# Patient Record
Sex: Female | Born: 1985 | Race: Black or African American | Hispanic: No | Marital: Single | Smoking: Current every day smoker
Health system: Southern US, Community
[De-identification: ages and names within clinical notes are randomized; demographics above are authoritative.]

## PROBLEM LIST (undated history)

## (undated) ENCOUNTER — Inpatient Hospital Stay (HOSPITAL_COMMUNITY): Payer: Self-pay

## (undated) DIAGNOSIS — Z8619 Personal history of other infectious and parasitic diseases: Secondary | ICD-10-CM

## (undated) DIAGNOSIS — Z789 Other specified health status: Secondary | ICD-10-CM

## (undated) HISTORY — PX: ANTERIOR CRUCIATE LIGAMENT REPAIR: SHX115

## (undated) HISTORY — DX: Personal history of other infectious and parasitic diseases: Z86.19

---

## 2006-11-27 ENCOUNTER — Emergency Department (HOSPITAL_COMMUNITY): Admission: EM | Admit: 2006-11-27 | Discharge: 2006-11-27 | Payer: Self-pay | Admitting: Emergency Medicine

## 2007-09-24 ENCOUNTER — Emergency Department (HOSPITAL_COMMUNITY): Admission: EM | Admit: 2007-09-24 | Discharge: 2007-09-24 | Payer: Self-pay | Admitting: Emergency Medicine

## 2008-01-09 ENCOUNTER — Emergency Department (HOSPITAL_COMMUNITY): Admission: EM | Admit: 2008-01-09 | Discharge: 2008-01-09 | Payer: Self-pay | Admitting: Emergency Medicine

## 2010-12-30 ENCOUNTER — Emergency Department (HOSPITAL_COMMUNITY)
Admission: EM | Admit: 2010-12-30 | Discharge: 2010-12-30 | Disposition: A | Discharge status: Home / Self Care | Payer: Self-pay | Attending: Emergency Medicine | Admitting: Emergency Medicine

## 2010-12-30 ENCOUNTER — Emergency Department (HOSPITAL_COMMUNITY): Payer: Self-pay

## 2010-12-30 DIAGNOSIS — R112 Nausea with vomiting, unspecified: Secondary | ICD-10-CM | POA: Insufficient documentation

## 2010-12-30 DIAGNOSIS — R059 Cough, unspecified: Secondary | ICD-10-CM | POA: Insufficient documentation

## 2010-12-30 DIAGNOSIS — R05 Cough: Secondary | ICD-10-CM | POA: Insufficient documentation

## 2010-12-30 LAB — URINE MICROSCOPIC-ADD ON

## 2010-12-30 LAB — URINALYSIS, ROUTINE W REFLEX MICROSCOPIC
Nitrite: NEGATIVE
Protein, ur: NEGATIVE mg/dL
Specific Gravity, Urine: 1.02 (ref 1.005–1.030)
Urobilinogen, UA: 0.2 mg/dL (ref 0.0–1.0)

## 2013-08-17 ENCOUNTER — Encounter (HOSPITAL_COMMUNITY): Payer: Self-pay | Admitting: Emergency Medicine

## 2013-08-17 DIAGNOSIS — F17200 Nicotine dependence, unspecified, uncomplicated: Secondary | ICD-10-CM | POA: Insufficient documentation

## 2013-08-17 DIAGNOSIS — Z3201 Encounter for pregnancy test, result positive: Secondary | ICD-10-CM | POA: Insufficient documentation

## 2013-08-17 DIAGNOSIS — R109 Unspecified abdominal pain: Secondary | ICD-10-CM | POA: Insufficient documentation

## 2013-08-17 LAB — CBC WITH DIFFERENTIAL/PLATELET
BASOS ABS: 0 10*3/uL (ref 0.0–0.1)
Basophils Relative: 0 % (ref 0–1)
EOS PCT: 2 % (ref 0–5)
Eosinophils Absolute: 0.3 10*3/uL (ref 0.0–0.7)
HCT: 33.6 % — ABNORMAL LOW (ref 36.0–46.0)
Hemoglobin: 11.8 g/dL — ABNORMAL LOW (ref 12.0–15.0)
Lymphocytes Relative: 24 % (ref 12–46)
Lymphs Abs: 3.6 10*3/uL (ref 0.7–4.0)
MCH: 31.9 pg (ref 26.0–34.0)
MCHC: 35.1 g/dL (ref 30.0–36.0)
MCV: 90.8 fL (ref 78.0–100.0)
MONOS PCT: 7 % (ref 3–12)
Monocytes Absolute: 1.1 10*3/uL — ABNORMAL HIGH (ref 0.1–1.0)
NEUTROS PCT: 67 % (ref 43–77)
Neutro Abs: 10.2 10*3/uL — ABNORMAL HIGH (ref 1.7–7.7)
PLATELETS: 401 10*3/uL — AB (ref 150–400)
RBC: 3.7 MIL/uL — AB (ref 3.87–5.11)
RDW: 13.3 % (ref 11.5–15.5)
WBC: 15.2 10*3/uL — AB (ref 4.0–10.5)

## 2013-08-17 LAB — COMPREHENSIVE METABOLIC PANEL
ALBUMIN: 4 g/dL (ref 3.5–5.2)
ALT: 10 U/L (ref 0–35)
AST: 11 U/L (ref 0–37)
Alkaline Phosphatase: 57 U/L (ref 39–117)
BUN: 7 mg/dL (ref 6–23)
CALCIUM: 9.7 mg/dL (ref 8.4–10.5)
CHLORIDE: 101 meq/L (ref 96–112)
CO2: 21 meq/L (ref 19–32)
Creatinine, Ser: 0.67 mg/dL (ref 0.50–1.10)
GFR calc Af Amer: >90 mL/min (ref >90)
Glucose, Bld: 96 mg/dL (ref 70–99)
Potassium: 3.8 mEq/L (ref 3.7–5.3)
SODIUM: 138 meq/L (ref 137–147)
Total Protein: 7.5 g/dL (ref 6.0–8.3)

## 2013-08-17 LAB — PREGNANCY, URINE: PREG TEST UR: POSITIVE — AB

## 2013-08-17 LAB — URINALYSIS, ROUTINE W REFLEX MICROSCOPIC
Bilirubin Urine: NEGATIVE
GLUCOSE, UA: NEGATIVE mg/dL
HGB URINE DIPSTICK: NEGATIVE
Ketones, ur: NEGATIVE mg/dL
LEUKOCYTES UA: NEGATIVE
Nitrite: NEGATIVE
PH: 5.5 (ref 5.0–8.0)
PROTEIN: NEGATIVE mg/dL
Specific Gravity, Urine: 1.028 (ref 1.005–1.030)
Urobilinogen, UA: 0.2 mg/dL (ref 0.0–1.0)

## 2013-08-17 LAB — LIPASE, BLOOD: Lipase: 27 U/L (ref 11–59)

## 2013-08-17 NOTE — ED Notes (Signed)
Pt in c/o lower abd pain and lower back pain x3 weeks, states pain is intermittent, denies urinary or vaginal symptoms, intermittent diarrhea, no distress noted

## 2013-08-18 ENCOUNTER — Emergency Department (HOSPITAL_COMMUNITY): Payer: BC Managed Care – PPO

## 2013-08-18 ENCOUNTER — Emergency Department (HOSPITAL_COMMUNITY)
Admission: EM | Admit: 2013-08-18 | Discharge: 2013-08-18 | Disposition: A | Discharge status: Home / Self Care | Payer: BC Managed Care – PPO | Attending: Emergency Medicine | Admitting: Emergency Medicine

## 2013-08-18 DIAGNOSIS — Z3490 Encounter for supervision of normal pregnancy, unspecified, unspecified trimester: Secondary | ICD-10-CM

## 2013-08-18 DIAGNOSIS — R252 Cramp and spasm: Secondary | ICD-10-CM

## 2013-08-18 DIAGNOSIS — M549 Dorsalgia, unspecified: Secondary | ICD-10-CM

## 2013-08-18 LAB — GC/CHLAMYDIA PROBE AMP
CT PROBE, AMP APTIMA: NEGATIVE
GC Probe RNA: NEGATIVE

## 2013-08-18 LAB — HCG, QUANTITATIVE, PREGNANCY: HCG, BETA CHAIN, QUANT, S: 50349 m[IU]/mL — AB (ref <5)

## 2013-08-18 LAB — ABO/RH: ABO/RH(D): O POS

## 2013-08-18 LAB — WET PREP, GENITAL
TRICH WET PREP: NONE SEEN
Yeast Wet Prep HPF POC: NONE SEEN

## 2013-08-18 MED ORDER — PRENATAL COMPLETE 14-0.4 MG PO TABS: 1.0000 | ORAL_TABLET | Freq: Every day | ORAL | Status: DC

## 2013-08-18 NOTE — ED Provider Notes (Signed)
Medical screening examination/treatment/procedure(s) were performed by non-physician practitioner and as supervising physician I was immediately available for consultation/collaboration.   Dione Boozeavid Ahri Olson, MD 08/18/13 [phone]

## 2013-08-18 NOTE — Discharge Instructions (Signed)
Use Tylenol for aches and pains. Use heating pads for comfort over sore muscles. Follow up with your OB/GYN provider for continued evaluation and treatment.    Pregnancy If you are planning on getting pregnant, it is a good idea to make a preconception appointment with your caregiver to discuss having a healthy lifestyle before getting pregnant. This includes diet, weight, exercise, taking prenatal vitamins (especially folic acid, which helps prevent brain and spinal cord defects), avoiding alcohol, smoking and illegal drugs, medical problems (diabetes, convulsions), family history of genetic problems, working conditions, and immunizations. It is better to have knowledge of these things and do something about them before getting pregnant. During your pregnancy, it is important to follow certain guidelines in order to have a healthy baby. It is very important to get good prenatal care and follow your caregiver's instructions. Prenatal care includes all the medical care you receive before your baby's birth. This helps to prevent problems during the pregnancy and childbirth. HOME CARE INSTRUCTIONS   Start your prenatal visits by the 12th week of pregnancy or earlier, if possible. At first, appointments are usually scheduled monthly. They become more frequent in the last 2 months before delivery. It is important that you keep your caregiver's appointments and follow your caregiver's instructions regarding medication use, exercise, and diet.  During pregnancy, you are providing food for you and your baby. Eat a regular, well-balanced diet. Choose foods such as meat, fish, milk and other dairy products, vegetables, fruits, whole-grain breads and cereals. Your caregiver will inform you of the ideal weight gain depending on your current height and weight. Drink lots of liquids. Try to drink 8 glasses of water a day.  Alcohol is associated with a number of birth defects including fetal alcohol syndrome. It is  best to avoid alcohol completely. Smoking will cause low birth rate and prematurity. Use of alcohol and nicotine during your pregnancy also increases the chances that your child will be chemically dependent later in their life and may contribute to SIDS (Sudden Infant Death Syndrome).  Do not use illegal drugs.  Only take prescription or over-the-counter medications that are recommended by your caregiver. Other medications can cause genetic and physical problems in the baby.  Morning sickness can often be helped by keeping soda crackers at the bedside. Eat a few before getting up in the morning.  A sexual relationship may be continued until near the end of pregnancy if there are no other problems such as early (premature) leaking of amniotic fluid from the membranes, vaginal bleeding, painful intercourse or belly (abdominal) pain.  Exercise regularly. Check with your caregiver if you are unsure of the safety of some of your exercises.  Do not use hot tubs, steam rooms or saunas. These increase the risk of fainting and hurting yourself and the baby. Swimming is OK for exercise. Get plenty of rest, including afternoon naps when possible, especially in the third trimester.  Avoid toxic odors and chemicals.  Do not wear high heels. They may cause you to lose your balance and fall.  Do not lift over 5 pounds. If you do lift anything, lift with your legs and thighs, not your back.  Avoid long trips, especially in the third trimester.  If you have to travel out of the city or state, take a copy of your medical records with you. SEEK IMMEDIATE MEDICAL CARE IF:   You develop an unexplained oral temperature above 102 F (38.9 C), or as your caregiver suggests.  You have  leaking of fluid from the vagina. If leaking membranes are suspected, take your temperature and inform your caregiver of this when you call.  There is vaginal spotting or bleeding. Notify your caregiver of the amount and how many  pads are used.  You continue to feel sick to your stomach (nauseous) and have no relief from remedies suggested, or you throw up (vomit) blood or coffee ground like materials.  You develop upper abdominal pain.  You have round ligament discomfort in the lower abdominal area. This still must be evaluated by your caregiver.  You feel contractions of the uterus.  You do not feel the baby move, or there is less movement than before.  You have painful urination.  You have abnormal vaginal discharge.  You have persistent diarrhea.  You get a severe headache.  You have problems with your vision.  You develop muscle weakness.  You feel dizzy and faint.  You develop shortness of breath.  You develop chest pain.  You have back pain that travels down to your leg and feet.  You feel irregular or a very fast heartbeat.  You develop excessive weight gain in a short period of time (5 pounds in 3 to 5 days).  You are involved in a domestic violence situation. Document Released: 07/24/2005 Document Revised: 01/23/2012 Document Reviewed: 01/15/2009 [facility] Patient Information 2014 [place], [place].

## 2013-08-18 NOTE — ED Provider Notes (Signed)
CSN: [id number]     Arrival date & time 08/17/13  1900 History   First MD Initiated Contact with Patient 08/18/13 0105     Chief Complaint  Patient presents with  . Abdominal Pain  . Back Pain   HPI  History provided by the patient. Patient is a 28 year old female presents with complaints of lower abdomen and back pains and cramps for the past 3 weeks. Patient states that she does believe she is pregnant with a positive home pregnancy test. Her last normal menstrual period was November 11. Patient states that over the last several weeks she has had some intermittent cramping and discomfort. She has not been using any medications to treat symptoms. She does report symptoms are improved with some positions and laying in a comfortable bed. Denies having any associated vaginal discharge or bleeding. No urinary symptoms. No dysuria, hematuria or urinary frequency. Denies any fever, chills or sweats.    History reviewed. No pertinent past medical history. History reviewed. No pertinent past surgical history. History reviewed. No pertinent family history. History  Substance Use Topics  . Smoking status: Smoker, Current Status Unknown  . Smokeless tobacco: Not on file  . Alcohol Use: Not on file   OB History   Grav Para Term Preterm Abortions TAB SAB Ect Mult Living                 Review of Systems  Constitutional: Negative for fever.  Gastrointestinal: Positive for abdominal pain. Negative for vomiting, diarrhea and constipation.  Genitourinary: Negative for dysuria, frequency, hematuria, flank pain, vaginal bleeding and vaginal discharge.  Musculoskeletal: Positive for back pain.  All other systems reviewed and are negative.    Allergies  Other  Home Medications   Current Outpatient Rx  Name  Route  Sig  Dispense  Refill  . acetaminophen (TYLENOL) 500 MG tablet   Oral   Take 1,000 mg by mouth 2 (two) times daily as needed for mild pain.         . clindamycin-tretinoin  (ZIANA) gel   Topical   Apply 1 application topically at bedtime.         . Multiple Vitamins-Minerals (MULTIVITAMIN PO)   Oral   Take 1 tablet by mouth daily.          BP 108/65  Pulse 78  Temp(Src) 98.5 F (36.9 C) (Oral)  Resp 18  SpO2 100% Physical Exam  Nursing note and vitals reviewed. Constitutional: She is oriented to person, place, and time. She appears well-developed and well-nourished. No distress.  HENT:  Head: Normocephalic.  Cardiovascular: Normal rate and regular rhythm.   Pulmonary/Chest: Effort normal and breath sounds normal. No respiratory distress. She has no wheezes. She has no rales.  Abdominal: Soft. She exhibits no distension. There is tenderness. There is no rebound and no guarding.   Mild lower abdomen suprapubic tenderness  Genitourinary:  Chaperone was present. Cervix closed. Thick white vaginal discharge present. Mild tenderness. No masses.  Neurological: She is alert and oriented to person, place, and time.  Skin: Skin is warm and dry. No rash noted.  Psychiatric: She has a normal mood and affect. Her behavior is normal.    ED Course  Procedures    COORDINATION OF CARE:  Nursing notes reviewed. Vital signs reviewed. Initial pt interview and examination performed.   Patient seen and evaluated. She appears comfortable no acute distress or significant pain and discomfort. She does not appear severely ill or toxic. Patient did have  positive home pregnancy test. Last normal menstrual period was November 11. Discussed work up plan with pt at bedside, which includes lab testing, ultrasound and pelvic exam. Pt agrees with plan.    Results for orders placed during the hospital encounter of 08/18/13  WET PREP, GENITAL      Result Value Range   Yeast Wet Prep HPF POC NONE SEEN  NONE SEEN   Trich, Wet Prep NONE SEEN  NONE SEEN   Clue Cells Wet Prep HPF POC MODERATE (*) NONE SEEN   WBC, Wet Prep HPF POC FEW (*) NONE SEEN  CBC WITH DIFFERENTIAL       Result Value Range   WBC 15.2 (*) 4.0 - 10.5 K/uL   RBC 3.70 (*) 3.87 - 5.11 MIL/uL   Hemoglobin 11.8 (*) 12.0 - 15.0 g/dL   HCT 04.533.6 (*) 40.936.0 - 81.146.0 %   MCV 90.8  78.0 - 100.0 fL   MCH 31.9  26.0 - 34.0 pg   MCHC 35.1  30.0 - 36.0 g/dL   RDW 91.413.3  78.211.5 - 95.615.5 %   Platelets 401 (*) 150 - 400 K/uL   Neutrophils Relative % 67  43 - 77 %   Lymphocytes Relative 24  12 - 46 %   Monocytes Relative 7  3 - 12 %   Eosinophils Relative 2  0 - 5 %   Basophils Relative 0  0 - 1 %   Neutro Abs 10.2 (*) 1.7 - 7.7 K/uL   Lymphs Abs 3.6  0.7 - 4.0 K/uL   Monocytes Absolute 1.1 (*) 0.1 - 1.0 K/uL   Eosinophils Absolute 0.3  0.0 - 0.7 K/uL   Basophils Absolute 0.0  0.0 - 0.1 K/uL   WBC Morphology ATYPICAL LYMPHOCYTES    COMPREHENSIVE METABOLIC PANEL      Result Value Range   Sodium 138  137 - 147 mEq/L   Potassium 3.8  3.7 - 5.3 mEq/L   Chloride 101  96 - 112 mEq/L   CO2 21  19 - 32 mEq/L   Glucose, Bld 96  70 - 99 mg/dL   BUN 7  6 - 23 mg/dL   Creatinine, Ser 2.130.67  0.50 - 1.10 mg/dL   Calcium 9.7  8.4 - 08.610.5 mg/dL   Total Protein 7.5  6.0 - 8.3 g/dL   Albumin 4.0  3.5 - 5.2 g/dL   AST 11  0 - 37 U/L   ALT 10  0 - 35 U/L   Alkaline Phosphatase 57  39 - 117 U/L   Total Bilirubin <0.2 (*) 0.3 - 1.2 mg/dL   GFR calc non Af Amer >90  >90 mL/min   GFR calc Af Amer >90  >90 mL/min  LIPASE, BLOOD      Result Value Range   Lipase 27  11 - 59 U/L  URINALYSIS, ROUTINE W REFLEX MICROSCOPIC      Result Value Range   Color, Urine YELLOW  YELLOW   APPearance CLEAR  CLEAR   Specific Gravity, Urine 1.028  1.005 - 1.030   pH 5.5  5.0 - 8.0   Glucose, UA NEGATIVE  NEGATIVE mg/dL   Hgb urine dipstick NEGATIVE  NEGATIVE   Bilirubin Urine NEGATIVE  NEGATIVE   Ketones, ur NEGATIVE  NEGATIVE mg/dL   Protein, ur NEGATIVE  NEGATIVE mg/dL   Urobilinogen, UA 0.2  0.0 - 1.0 mg/dL   Nitrite NEGATIVE  NEGATIVE   Leukocytes, UA NEGATIVE  NEGATIVE  PREGNANCY, URINE  Result Value Range   Preg Test, Ur  POSITIVE (*) NEGATIVE  HCG, QUANTITATIVE, PREGNANCY      Result Value Range   hCG, Beta Chain, Quant, S 40981 (*) <5 mIU/mL  ABO/RH      Result Value Range   ABO/RH(D) O POS     No rh immune globuloin NOT A RH IMMUNE GLOBULIN CANDIDATE, PT RH POSITIVE         Imaging Review US Ob Comp Less 14 Wks  08/18/2013   CLINICAL DATA:  Abdominal pain and pregnancy  EXAM: OBSTETRIC <14 WK [place] AND TRANSVAGINAL OB US  TECHNIQUE: Both transabdominal and transvaginal ultrasound examinations were performed for complete evaluation of the gestation as well as the maternal uterus, adnexal regions, and pelvic cul-de-sac. Transvaginal technique was performed to assess early pregnancy.  COMPARISON:  None.  FINDINGS: Intrauterine gestational sac: Visualized/normal in shape. No subchorionic hematoma.  Yolk sac:  Present  Embryo:  Present  Cardiac Activity: Present  Heart Rate:  168 bpm  CRL:   18  mm   8 w 2 d                  [place] EDC: 03/28/2014  Maternal uterus/adnexae: The ovaries are symmetric and normal in size. There is a small volume of simple appearing fluid in the lower uterine segment and cervical canal. Some thickening in the left aspect of the upper uterine body raises the question of a fibroid in this location, although the relatively homogeneous echotexture and lack of shadowing argues against a uterine mass. No significant free fluid P  IMPRESSION: 1. Single living intrauterine gestation, estimated age 52 weeks 2 days. 2. Small volume fluid in the lower uterine/endocervical canal.   Electronically Signed   By: Tiburcio Pea M.D.   On: 08/18/2013 02:34   US Ob Transvaginal  08/18/2013   CLINICAL DATA:  Abdominal pain and pregnancy  EXAM: OBSTETRIC <14 WK [place] AND TRANSVAGINAL OB US  TECHNIQUE: Both transabdominal and transvaginal ultrasound examinations were performed for complete evaluation of the gestation as well as the maternal uterus, adnexal regions, and pelvic cul-de-sac. Transvaginal technique was  performed to assess early pregnancy.  COMPARISON:  None.  FINDINGS: Intrauterine gestational sac: Visualized/normal in shape. No subchorionic hematoma.  Yolk sac:  Present  Embryo:  Present  Cardiac Activity: Present  Heart Rate:  168 bpm  CRL:   18  mm   8 w 2 d                  [place] EDC: 03/28/2014  Maternal uterus/adnexae: The ovaries are symmetric and normal in size. There is a small volume of simple appearing fluid in the lower uterine segment and cervical canal. Some thickening in the left aspect of the upper uterine body raises the question of a fibroid in this location, although the relatively homogeneous echotexture and lack of shadowing argues against a uterine mass. No significant free fluid P  IMPRESSION: 1. Single living intrauterine gestation, estimated age 52 weeks 2 days. 2. Small volume fluid in the lower uterine/endocervical canal.   Electronically Signed   By: Tiburcio Pea M.D.   On: 08/18/2013 02:34     MDM   1. Pregnancy   2. Muscle cramps   3. Back pain       Angus Seller, PA-C 08/18/13 [phone]

## 2013-09-16 LAB — OB RESULTS CONSOLE HEPATITIS B SURFACE ANTIGEN: Hepatitis B Surface Ag: NEGATIVE

## 2013-09-16 LAB — OB RESULTS CONSOLE HIV ANTIBODY (ROUTINE TESTING): HIV: NONREACTIVE

## 2013-09-16 LAB — OB RESULTS CONSOLE GC/CHLAMYDIA
CHLAMYDIA, DNA PROBE: NEGATIVE
GC PROBE AMP, GENITAL: NEGATIVE

## 2013-09-16 LAB — OB RESULTS CONSOLE RPR: RPR: NONREACTIVE

## 2013-09-16 LAB — OB RESULTS CONSOLE ABO/RH: RH Type: POSITIVE

## 2013-09-16 LAB — OB RESULTS CONSOLE ANTIBODY SCREEN: Antibody Screen: NEGATIVE

## 2013-09-16 LAB — OB RESULTS CONSOLE RUBELLA ANTIBODY, IGM: RUBELLA: NON-IMMUNE/NOT IMMUNE

## 2014-01-15 ENCOUNTER — Inpatient Hospital Stay (HOSPITAL_COMMUNITY)
Admission: AD | Admit: 2014-01-15 | Discharge: 2014-01-15 | Disposition: A | Discharge status: Other Acute Inpatient Hospital | Payer: BC Managed Care – PPO | Source: Ambulatory Visit | Attending: Obstetrics and Gynecology | Admitting: Obstetrics and Gynecology

## 2014-01-15 ENCOUNTER — Encounter (HOSPITAL_COMMUNITY): Payer: Self-pay | Admitting: Emergency Medicine

## 2014-01-15 DIAGNOSIS — S0990XA Unspecified injury of head, initial encounter: Secondary | ICD-10-CM | POA: Diagnosis not present

## 2014-01-15 DIAGNOSIS — Y9389 Activity, other specified: Secondary | ICD-10-CM | POA: Diagnosis not present

## 2014-01-15 DIAGNOSIS — Z87891 Personal history of nicotine dependence: Secondary | ICD-10-CM | POA: Diagnosis not present

## 2014-01-15 DIAGNOSIS — Z79899 Other long term (current) drug therapy: Secondary | ICD-10-CM | POA: Diagnosis not present

## 2014-01-15 DIAGNOSIS — O9989 Other specified diseases and conditions complicating pregnancy, childbirth and the puerperium: Secondary | ICD-10-CM | POA: Insufficient documentation

## 2014-01-15 DIAGNOSIS — O99891 Other specified diseases and conditions complicating pregnancy: Secondary | ICD-10-CM

## 2014-01-15 DIAGNOSIS — Z3490 Encounter for supervision of normal pregnancy, unspecified, unspecified trimester: Secondary | ICD-10-CM

## 2014-01-15 DIAGNOSIS — Y92410 Unspecified street and highway as the place of occurrence of the external cause: Secondary | ICD-10-CM | POA: Diagnosis not present

## 2014-01-15 DIAGNOSIS — S3981XA Other specified injuries of abdomen, initial encounter: Secondary | ICD-10-CM | POA: Insufficient documentation

## 2014-01-15 DIAGNOSIS — R51 Headache: Secondary | ICD-10-CM

## 2014-01-15 MED ORDER — ACETAMINOPHEN 325 MG PO TABS
650.0000 mg | ORAL_TABLET | Freq: Once | ORAL | Status: AC
  Administered 2014-01-15: 650 mg via ORAL
  Filled 2014-01-15: qty 2

## 2014-01-15 NOTE — ED Notes (Signed)
Vital signs stable. 
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
## 2014-01-15 NOTE — ED Notes (Signed)
[facility] at bedside 

## 2014-01-15 NOTE — Progress Notes (Signed)
Dr Ambrose Mantle notified of pt status. Pt to be transferred to MAU at [facility] for monitoring. FH Cat 1, pt stable for transfer.

## 2014-01-15 NOTE — Discharge Instructions (Signed)
Placental Abruption °Placental abruption is when the placenta partially or completely separates from the uterus before the baby (fetus) is born. The placenta is the organ that provides nourishment to the baby. Normally, the placenta does not detach from the womb until after the baby is born. When it is large and separates before the baby is born, it may be a threat to the baby and mother's life. A small abruption may not be noticed until after the birth. Placenta abruption is uncommon. °CAUSES  °Often times, your caregiver will not know the cause. However, some uncommon causes include:  °· Abdominal injury. °· Turning a baby that is presenting their buttocks first (breech) or is lying sideways in the uterus (transverse) to a headfirst position (external cephalic version). °· Delivering the first twin. °· Sudden loss of amniotic fluid (premature rupture of the membranes). °· Abnormally short umbilical cord. °SYMPTOMS  °When the placental separation is small, it may not produce symptoms. There may be a small amount of belly (abdominal) pain or slight amount of vaginal bleeding.  °Symptoms of severe problems will depend on the size of the separation and the stage of pregnancy. Symptoms may include:  °· Vaginal bleeding. °· Uterine tenderness. °· Fetal distress detected by fetal monitoring. °· Severe abdominal pain with tenderness. °· Continual uterine contraction (tetany). °· Back pain. °· Maternal shock with severe hemorrhage. °RISK FACTORS °· History of abruption. °· High blood pressure. °· Smoking and alcohol intake. °· Blood clotting problems. °· Too much fluid in the baby's sac (polyhydramnios). °· Twins or more. °· High blood pressure during pregnancy (preeclampsia) or seizures and convulsions (eclampsia). °· Diabetes. °· Having had more than four children. °· Pregnancy in older women (35 years or older). °· Illegal drugs. °· Injury or trauma to the abdomen. °PREVENTION  °Prevention begins with good prenatal  care: °· Stop using alcohol, illegal drugs and smoking. °· Obey traffic laws and practice defensive driving. °· Avoid dangerous activities such as snow and water skiing, horseback riding, motorcycles and mountain climbing. °· Wear seat belts properly and at all times. °· Control high blood pressure and diabetes. °· Avoid situations where there is domestic violence. °DIAGNOSIS  °Placental abruption is suspected when a pregnant woman develops sudden uterine pain with or without bleeding. The uterus usually is very tender and hard. It may be enlarging because of the bleeding and the fetus may show signs of distress. Distress may show up as an abnormal heart rate or rhythm. When your caregiver sees these signs, they may do an ultrasound test to look for a clot behind the placenta. They will also do blood work to make sure there are not clotting problems, signs of too much blood loss, or not enough healthy red blood cells (anemia). These all require a blood transfusion. °TREATMENT  °Treatment depends on many things such as:  °· The amount of bleeding. °· Distress with the baby or mother. °· How far along the pregnancy is. °· The maturity of the baby. °This condition is usually an emergency. When the mother or fetus is in distress, it requires treatment right away to protect the safety of the mother and infant. If the baby is mature and delivery time is near:  °· Careful observation may allow the baby to be delivered vaginally. A vaginal birth is usually preferred over caesarean section unless there is fetal distress. °· Sometimes, a caesarean section cannot be done if there are clotting problems or a DIC. °If the symptoms are severe and   delivery is not about to happen:  °· A cesarean section may be done. This is an operation on the abdomen to remove the baby. °If the symptoms are mild and there are no signs of distress with the baby or mother:  °· You may have to stay in the hospital for a couple of days for  observation. °· You may be given steroid medication to get the baby's lungs mature when necessary. °· If you are Rh negative and the father is Rh positive, you may get Rho-gam to prevent Rh problems in the baby. °· When everything is ok and safe, you may go home and be placed on bed rest. °HOME CARE INSTRUCTIONS  °· Take all medications as directed by your caregiver. °· Keep all your follow-up prenatal visits. °· Arrange for help at home before and after you deliver the baby, especially if you had a Cesarean section or a large amount of bleeding. °· Get plenty of rest and sleep, especially after the baby is born. °· Eat a nutritious and balanced diet. °· Do not have sexual intercourse, use tampons or douche with out your caregiver's permission. °SEEK IMMEDIATE MEDICAL CARE IF: °Before delivery: °· Any type of vaginal bleeding. °· Abdominal pain °· Continuous uterine contractions. °· A hard, tender uterus. °· You do not feel the baby move or the baby has very little movement. °After delivery: °· Started to pass large clots or pieces of tissue. This may be small pieces of placenta left following delivery. °· Noticed that you are soaking more than one sanitary pad per hour, for several hours. °· Heavy, bright-red bleeding which occurs four days or more after delivery. °· A vaginal discharge which has a bad smell. °· An unexplained oral temperature above 100° F (37.8° C). °· Episodes of lightheadedness or fainting. °· Shortness of breath or a rapid heartbeat with very little activity (exertion). °· Abdominal pain. °· Leg or chest pain. °If you are having any of these symptoms, call your caregiver right away. °Document Released: 07/24/2005 Document Revised: 10/16/2011 Document Reviewed: 11/12/2008 °[employer]® Patient Information ©2014 [employer]. ° °

## 2014-01-15 NOTE — ED Notes (Signed)
Pt placed into gown upon arrival to room. Pt placed on monitor. Pt monitored by 12 lead, blood pressure, and pulse ox. Fetal monitor at bedside.

## 2014-01-15 NOTE — ED Notes (Signed)
Attempted to place IV when patient refused. She stated she did not wont one unless she absolutely needs to have one.
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
## 2014-01-15 NOTE — ED Notes (Signed)
MD at bedside. 

## 2014-01-15 NOTE — ED Notes (Signed)
OB Rapid response RN at bedside.   
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
## 2014-02-27 LAB — OB RESULTS CONSOLE GBS: STREP GROUP B AG: NEGATIVE

## 2014-03-26 ENCOUNTER — Inpatient Hospital Stay (HOSPITAL_COMMUNITY)
Admission: AD | Admit: 2014-03-26 | Payer: No Typology Code available for payment source | Source: Ambulatory Visit | Admitting: Obstetrics and Gynecology

## 2014-03-30 ENCOUNTER — Encounter (HOSPITAL_COMMUNITY): Payer: Self-pay | Admitting: *Deleted

## 2014-03-30 ENCOUNTER — Telehealth (HOSPITAL_COMMUNITY): Payer: Self-pay | Admitting: *Deleted

## 2014-03-30 NOTE — Telephone Encounter (Signed)
Preadmission screen  

## 2014-03-31 ENCOUNTER — Encounter (HOSPITAL_COMMUNITY): Payer: Self-pay

## 2014-03-31 ENCOUNTER — Inpatient Hospital Stay (HOSPITAL_COMMUNITY)
Admission: RE | Admit: 2014-03-31 | Discharge: 2014-04-06 | DRG: 766 | Disposition: A | Discharge status: Home / Self Care | Payer: BC Managed Care – PPO | Source: Ambulatory Visit | Attending: Obstetrics and Gynecology | Admitting: Obstetrics and Gynecology

## 2014-03-31 VITALS — BP 136/80 | HR 101 | Temp 98.5°F | Resp 20 | Ht 66.0 in | Wt 235.0 lb

## 2014-03-31 DIAGNOSIS — O480 Post-term pregnancy: Secondary | ICD-10-CM | POA: Diagnosis present

## 2014-03-31 DIAGNOSIS — Z6838 Body mass index (BMI) 38.0-38.9, adult: Secondary | ICD-10-CM | POA: Diagnosis not present

## 2014-03-31 DIAGNOSIS — Z8249 Family history of ischemic heart disease and other diseases of the circulatory system: Secondary | ICD-10-CM | POA: Diagnosis not present

## 2014-03-31 DIAGNOSIS — Z3490 Encounter for supervision of normal pregnancy, unspecified, unspecified trimester: Secondary | ICD-10-CM

## 2014-03-31 DIAGNOSIS — Z98891 History of uterine scar from previous surgery: Secondary | ICD-10-CM

## 2014-03-31 DIAGNOSIS — E669 Obesity, unspecified: Secondary | ICD-10-CM | POA: Diagnosis present

## 2014-03-31 DIAGNOSIS — Z833 Family history of diabetes mellitus: Secondary | ICD-10-CM

## 2014-03-31 DIAGNOSIS — Z3493 Encounter for supervision of normal pregnancy, unspecified, third trimester: Secondary | ICD-10-CM

## 2014-03-31 DIAGNOSIS — Z87891 Personal history of nicotine dependence: Secondary | ICD-10-CM | POA: Diagnosis not present

## 2014-03-31 DIAGNOSIS — O99891 Other specified diseases and conditions complicating pregnancy: Secondary | ICD-10-CM | POA: Diagnosis present

## 2014-03-31 DIAGNOSIS — O99214 Obesity complicating childbirth: Secondary | ICD-10-CM

## 2014-03-31 HISTORY — DX: Other specified health status: Z78.9

## 2014-03-31 LAB — CBC
HEMATOCRIT: 35.1 % — AB (ref 36.0–46.0)
Hemoglobin: 11.9 g/dL — ABNORMAL LOW (ref 12.0–15.0)
MCH: 30.6 pg (ref 26.0–34.0)
MCHC: 33.9 g/dL (ref 30.0–36.0)
MCV: 90.2 fL (ref 78.0–100.0)
Platelets: 338 10*3/uL (ref 150–400)
RBC: 3.89 MIL/uL (ref 3.87–5.11)
RDW: 14.7 % (ref 11.5–15.5)
WBC: 14.2 10*3/uL — ABNORMAL HIGH (ref 4.0–10.5)

## 2014-03-31 MED ORDER — IBUPROFEN 600 MG PO TABS: 600.0000 mg | ORAL_TABLET | Freq: Four times a day (QID) | ORAL | Status: DC | PRN

## 2014-03-31 MED ORDER — OXYTOCIN BOLUS FROM INFUSION: 500.0000 mL | INTRAVENOUS | Status: DC

## 2014-03-31 MED ORDER — OXYTOCIN 40 UNITS IN LACTATED RINGERS INFUSION - SIMPLE MED
1.0000 m[IU]/min | INTRAVENOUS | Status: DC
  Administered 2014-04-01: 2 m[IU]/min via INTRAVENOUS
  Filled 2014-03-31: qty 1000

## 2014-03-31 MED ORDER — MISOPROSTOL 25 MCG QUARTER TABLET
25.0000 ug | ORAL_TABLET | ORAL | Status: DC | PRN
  Administered 2014-03-31: 25 ug via VAGINAL
  Filled 2014-03-31: qty 0.25

## 2014-03-31 MED ORDER — BUTORPHANOL TARTRATE 1 MG/ML IJ SOLN
1.0000 mg | INTRAMUSCULAR | Status: DC | PRN
  Administered 2014-04-01: 1 mg via INTRAVENOUS
  Filled 2014-03-31: qty 1

## 2014-03-31 MED ORDER — ONDANSETRON HCL 4 MG/2ML IJ SOLN: 4.0000 mg | Freq: Four times a day (QID) | INTRAMUSCULAR | Status: DC | PRN

## 2014-03-31 MED ORDER — LACTATED RINGERS IV SOLN: 500.0000 mL | INTRAVENOUS | Status: DC | PRN

## 2014-03-31 MED ORDER — ZOLPIDEM TARTRATE 5 MG PO TABS: 5.0000 mg | ORAL_TABLET | Freq: Every evening | ORAL | Status: DC | PRN

## 2014-03-31 MED ORDER — LIDOCAINE HCL (PF) 1 % IJ SOLN: 30.0000 mL | INTRAMUSCULAR | Status: DC | PRN

## 2014-03-31 MED ORDER — ACETAMINOPHEN 325 MG PO TABS
650.0000 mg | ORAL_TABLET | ORAL | Status: DC | PRN
Start: 2014-03-31 — End: 2014-04-02

## 2014-03-31 MED ORDER — OXYTOCIN 40 UNITS IN LACTATED RINGERS INFUSION - SIMPLE MED: 62.5000 mL/h | INTRAVENOUS | Status: DC

## 2014-03-31 MED ORDER — CITRIC ACID-SODIUM CITRATE 334-500 MG/5ML PO SOLN
30.0000 mL | ORAL | Status: DC | PRN
  Administered 2014-04-02: 30 mL via ORAL
  Filled 2014-03-31: qty 15

## 2014-03-31 MED ORDER — OXYCODONE-ACETAMINOPHEN 5-325 MG PO TABS: 1.0000 | ORAL_TABLET | ORAL | Status: DC | PRN

## 2014-03-31 MED ORDER — TERBUTALINE SULFATE 1 MG/ML IJ SOLN: 0.2500 mg | Freq: Once | INTRAMUSCULAR | Status: AC | PRN

## 2014-03-31 MED ORDER — LACTATED RINGERS IV SOLN
INTRAVENOUS | Status: DC
  Administered 2014-03-31 – 2014-04-02 (×5): via INTRAVENOUS

## 2014-03-31 NOTE — H&P (Signed)
Kathryn Moyer is a 28 y.o. female G1P0 at 63 5/7 weeks (EDD 03/26/14 by LMP c/w 8 weeks [place]) presenting for IOL.  Prenatal care uneventful.  She is rubella non-immune.  There was some fundal lag, but [place] 7/30 showed normal growth at the 40%ile.  Maternal Medical History:  Contractions: Frequency: irregular.   Perceived severity is mild.    Fetal activity: Perceived fetal activity is normal.    Prenatal Complications - Diabetes: none.    OB History   Grav Para Term Preterm Abortions TAB SAB Ect Mult Living   1              Past Medical History  Diagnosis Date  . Hx of varicella    Past Surgical History  Procedure Laterality Date  . Anterior cruciate ligament repair      R klnee   Family History: family history includes Cancer in her maternal grandmother; Diabetes in her maternal grandfather and paternal grandfather; Hearing loss in her paternal grandfather and paternal grandmother; Hypertension in her father and mother. Social History:  reports that she has quit smoking. She does not have any smokeless tobacco history on file. She reports that she does not drink alcohol or use illicit drugs.   Prenatal Transfer Tool  Maternal Diabetes: No Genetic Screening: Normal Maternal Ultrasounds/Referrals: Normal Fetal Ultrasounds or other Referrals:  None Maternal Substance Abuse:  No Significant Maternal Medications:  None Significant Maternal Lab Results:  None Other Comments:  None  ROS    There were no vitals taken for this visit. Maternal Exam:  Uterine Assessment: Contraction strength is mild.  Contraction frequency is irregular.   Abdomen: Patient reports no abdominal tenderness. Fetal presentation: vertex  Introitus: Normal vulva. Normal vagina.  Pelvis: adequate for delivery.      Physical Exam  Constitutional: She appears well-developed and well-nourished.  Cardiovascular: Normal rate and regular rhythm.   Respiratory: Effort normal.  GI: Soft.  Genitourinary:  Vagina normal and uterus normal.  Neurological: She is alert.  Psychiatric: Her behavior is normal.    Prenatal labs: ABO, Rh: O/Positive/-- (02/10 0000) Antibody: Negative (02/10 0000) Rubella: Nonimmune (02/10 0000) RPR: Nonreactive (02/10 0000)  HBsAg: Negative (02/10 0000)  HIV: Non-reactive (02/10 0000)  GBS: Negative (07/24 0000)  First trimester screen and AFP WNL One hour GTT 95  Assessment/Plan: Pt with uncomplicated postterm pregnancy for ripening and IOL.  Plan cytotec this PM and pitocin/AROM in AM.   Huel Cote W 03/31/2014, 6:25 PM

## 2014-04-01 ENCOUNTER — Inpatient Hospital Stay (HOSPITAL_COMMUNITY): Admission: RE | Admit: 2014-04-01 | Payer: No Typology Code available for payment source | Source: Ambulatory Visit

## 2014-04-01 ENCOUNTER — Encounter (HOSPITAL_COMMUNITY): Payer: Self-pay

## 2014-04-01 ENCOUNTER — Encounter (HOSPITAL_COMMUNITY): Payer: BC Managed Care – PPO | Admitting: Anesthesiology

## 2014-04-01 ENCOUNTER — Inpatient Hospital Stay (HOSPITAL_COMMUNITY): Payer: BC Managed Care – PPO | Admitting: Anesthesiology

## 2014-04-01 LAB — RPR

## 2014-04-01 MED ORDER — SODIUM BICARBONATE 8.4 % IV SOLN
INTRAVENOUS | Status: DC | PRN
  Administered 2014-04-01: 7 mL via EPIDURAL

## 2014-04-01 MED ORDER — PHENYLEPHRINE 40 MCG/ML (10ML) SYRINGE FOR IV PUSH (FOR BLOOD PRESSURE SUPPORT)
80.0000 ug | PREFILLED_SYRINGE | INTRAVENOUS | Status: DC | PRN
  Filled 2014-04-01: qty 10

## 2014-04-01 MED ORDER — PHENYLEPHRINE 40 MCG/ML (10ML) SYRINGE FOR IV PUSH (FOR BLOOD PRESSURE SUPPORT): 80.0000 ug | PREFILLED_SYRINGE | INTRAVENOUS | Status: DC | PRN

## 2014-04-01 MED ORDER — FENTANYL 2.5 MCG/ML BUPIVACAINE 1/10 % EPIDURAL INFUSION (WH - ANES)
14.0000 mL/h | INTRAMUSCULAR | Status: DC | PRN
  Administered 2014-04-01 (×3): 14 mL/h via EPIDURAL
  Filled 2014-04-01 (×3): qty 125

## 2014-04-01 MED ORDER — DIPHENHYDRAMINE HCL 50 MG/ML IJ SOLN: 12.5000 mg | INTRAMUSCULAR | Status: DC | PRN

## 2014-04-01 MED ORDER — FENTANYL 2.5 MCG/ML BUPIVACAINE 1/10 % EPIDURAL INFUSION (WH - ANES)
14.0000 mL/h | INTRAMUSCULAR | Status: DC | PRN
  Administered 2014-04-01: 14 mL/h via EPIDURAL

## 2014-04-01 MED ORDER — LACTATED RINGERS IV SOLN
500.0000 mL | Freq: Once | INTRAVENOUS | Status: AC
  Administered 2014-04-01: 500 mL via INTRAVENOUS

## 2014-04-01 MED ORDER — EPHEDRINE 5 MG/ML INJ: 10.0000 mg | INTRAVENOUS | Status: DC | PRN

## 2014-04-01 MED ORDER — LIDOCAINE HCL (PF) 1 % IJ SOLN
INTRAMUSCULAR | Status: DC | PRN
  Administered 2014-04-01: 10 mL

## 2014-04-01 MED ORDER — EPHEDRINE 5 MG/ML INJ
10.0000 mg | INTRAVENOUS | Status: DC | PRN
  Filled 2014-04-01: qty 4

## 2014-04-01 NOTE — Progress Notes (Signed)
Patient ID: Kathryn Moyer, female   DOB: [birth year], 28 y.o.   MRN: [id number] Pt mostly comfortable with epidural afeb vss Cervix c/5+/0 FHR overall reassuring good variability and accels, but continued early mild variables and occasional deeper variables with position change Amnioinfusion d/c'd Will continue to adjust pitocin up and follow progress

## 2014-04-01 NOTE — Anesthesia Procedure Notes (Signed)

## 2014-04-01 NOTE — Progress Notes (Signed)
Patient ID: Kathryn Moyer, female   DOB: Dec 24, 1985, 28 y.o.   MRN: [id number] Pt with some nausea, pain controlled afeb vss Cervix 100/5/-1 MVU not completely adequate, increasing pitocin FHR with good variability +scalp stim and +accels   Mild early variable decelerations ?OP

## 2014-04-01 NOTE — Anesthesia Preprocedure Evaluation (Signed)

## 2014-04-01 NOTE — Progress Notes (Signed)
Patient ID: Kathryn Moyer, female   DOB: [birth year], 28 y.o.   MRN: [id number] Pt feeling mild contractions afeb VSS FHR strip reviewed in entirety.  Good variability with intermittent variable decels, some accels AROM moderate meconium  70/2/-2 On pitocin and increasing FHR acceptable for now, will follow closely

## 2014-04-01 NOTE — Progress Notes (Signed)
Patient ID: Kathryn Moyer, female   DOB: 01/03/1986, 28 y.o.   MRN: [id number] Pt just received epidural, feeling some pressure, but nothing severe. afeb VSS Cervix 4/100/-1 FHR overall reassuring, some early/variable decels, but mild and good variability IUPC placed to adjust pitocin Will follow progress Fluid lighter with meconium

## 2014-04-01 NOTE — Progress Notes (Signed)
Patient ID: Kathryn Moyer, female   DOB: 01/10/1986, 28 y.o.   MRN: [id number] Pt feeling some pain on right afeb VSS FHR with good variability, some variable decels +scalp stim Cervix 90/6+/0 with contraction  Cervix has made minimal change for 6+ hours, from 5 to 6+ cm D/w pt slow progress and may need to proceed with c-section if continues to have no progress She is not opposed to this, but we will try to get her comfortable and give it a bit more time to see if the vertex will Descend more, feels transverse and slightly asynclitic Will recheck in 2 hours, contractions seem adequate on 8 mu of pitocin.

## 2014-04-02 ENCOUNTER — Encounter (HOSPITAL_COMMUNITY): Payer: Self-pay

## 2014-04-02 ENCOUNTER — Encounter (HOSPITAL_COMMUNITY)
Admission: RE | Disposition: A | Discharge status: Home / Self Care | Payer: Self-pay | Source: Ambulatory Visit | Attending: Obstetrics and Gynecology

## 2014-04-02 DIAGNOSIS — Z98891 History of uterine scar from previous surgery: Secondary | ICD-10-CM

## 2014-04-02 SURGERY — Surgical Case
Anesthesia: Epidural | Site: Abdomen

## 2014-04-02 MED ORDER — MEPERIDINE HCL 25 MG/ML IJ SOLN
INTRAMUSCULAR | Status: DC | PRN
  Administered 2014-04-02 (×2): 12.5 mg via INTRAVENOUS

## 2014-04-02 MED ORDER — LIDOCAINE-EPINEPHRINE 2 %-1:100000 IJ SOLN
INTRAMUSCULAR | Status: DC | PRN
  Administered 2014-04-02: 5 mL via INTRADERMAL
  Administered 2014-04-02: 10 mL via INTRADERMAL
  Administered 2014-04-02: 5 mL via INTRADERMAL

## 2014-04-02 MED ORDER — IBUPROFEN 600 MG PO TABS
600.0000 mg | ORAL_TABLET | Freq: Four times a day (QID) | ORAL | Status: DC
  Administered 2014-04-02 – 2014-04-06 (×14): 600 mg via ORAL
  Filled 2014-04-02 (×15): qty 1

## 2014-04-02 MED ORDER — KETOROLAC TROMETHAMINE 30 MG/ML IJ SOLN
INTRAMUSCULAR | Status: AC
  Filled 2014-04-02: qty 1

## 2014-04-02 MED ORDER — DIPHENHYDRAMINE HCL 25 MG PO CAPS: 25.0000 mg | ORAL_CAPSULE | Freq: Four times a day (QID) | ORAL | Status: DC | PRN

## 2014-04-02 MED ORDER — NALBUPHINE HCL 10 MG/ML IJ SOLN: 5.0000 mg | INTRAMUSCULAR | Status: DC | PRN

## 2014-04-02 MED ORDER — DEXTROSE 5 % IV SOLN
3.0000 g | INTRAVENOUS | Status: DC | PRN
  Administered 2014-04-02: 3 g via INTRAVENOUS

## 2014-04-02 MED ORDER — NALOXONE HCL 0.4 MG/ML IJ SOLN: 0.4000 mg | INTRAMUSCULAR | Status: DC | PRN

## 2014-04-02 MED ORDER — KETOROLAC TROMETHAMINE 30 MG/ML IJ SOLN: 30.0000 mg | Freq: Four times a day (QID) | INTRAMUSCULAR | Status: AC | PRN

## 2014-04-02 MED ORDER — OXYTOCIN 10 UNIT/ML IJ SOLN
40.0000 [IU] | INTRAVENOUS | Status: DC | PRN
  Administered 2014-04-02: 40 [IU] via INTRAVENOUS

## 2014-04-02 MED ORDER — MEPERIDINE HCL 25 MG/ML IJ SOLN
INTRAMUSCULAR | Status: AC
  Filled 2014-04-02: qty 1

## 2014-04-02 MED ORDER — DEXTROSE 5 % IV SOLN
INTRAVENOUS | Status: AC
  Filled 2014-04-02: qty 3000

## 2014-04-02 MED ORDER — SCOPOLAMINE 1 MG/3DAYS TD PT72
1.0000 | MEDICATED_PATCH | Freq: Once | TRANSDERMAL | Status: AC
  Administered 2014-04-02: 1.5 mg via TRANSDERMAL

## 2014-04-02 MED ORDER — ONDANSETRON HCL 4 MG/2ML IJ SOLN: 4.0000 mg | Freq: Three times a day (TID) | INTRAMUSCULAR | Status: DC | PRN

## 2014-04-02 MED ORDER — ONDANSETRON HCL 4 MG PO TABS: 4.0000 mg | ORAL_TABLET | ORAL | Status: DC | PRN

## 2014-04-02 MED ORDER — MEPERIDINE HCL 25 MG/ML IJ SOLN: 6.2500 mg | INTRAMUSCULAR | Status: DC | PRN

## 2014-04-02 MED ORDER — SENNOSIDES-DOCUSATE SODIUM 8.6-50 MG PO TABS
2.0000 | ORAL_TABLET | ORAL | Status: DC
  Administered 2014-04-02 – 2014-04-05 (×3): 2 via ORAL
  Filled 2014-04-02 (×4): qty 2

## 2014-04-02 MED ORDER — ZOLPIDEM TARTRATE 5 MG PO TABS: 5.0000 mg | ORAL_TABLET | Freq: Every evening | ORAL | Status: DC | PRN

## 2014-04-02 MED ORDER — KETOROLAC TROMETHAMINE 30 MG/ML IJ SOLN: 15.0000 mg | Freq: Once | INTRAMUSCULAR | Status: DC | PRN

## 2014-04-02 MED ORDER — DIPHENHYDRAMINE HCL 25 MG PO CAPS: 25.0000 mg | ORAL_CAPSULE | ORAL | Status: DC | PRN

## 2014-04-02 MED ORDER — NALOXONE HCL 1 MG/ML IJ SOLN
1.0000 ug/kg/h | INTRAVENOUS | Status: DC | PRN
  Filled 2014-04-02: qty 2

## 2014-04-02 MED ORDER — KETOROLAC TROMETHAMINE 30 MG/ML IJ SOLN
30.0000 mg | Freq: Four times a day (QID) | INTRAMUSCULAR | Status: AC | PRN
  Administered 2014-04-02 (×2): 30 mg via INTRAVENOUS
  Filled 2014-04-02: qty 1

## 2014-04-02 MED ORDER — DIPHENHYDRAMINE HCL 50 MG/ML IJ SOLN: 25.0000 mg | INTRAMUSCULAR | Status: DC | PRN

## 2014-04-02 MED ORDER — ONDANSETRON HCL 4 MG/2ML IJ SOLN
INTRAMUSCULAR | Status: AC
  Filled 2014-04-02: qty 2

## 2014-04-02 MED ORDER — TETANUS-DIPHTH-ACELL PERTUSSIS 5-2.5-18.5 LF-MCG/0.5 IM SUSP: 0.5000 mL | Freq: Once | INTRAMUSCULAR | Status: DC

## 2014-04-02 MED ORDER — IBUPROFEN 600 MG PO TABS: 600.0000 mg | ORAL_TABLET | Freq: Four times a day (QID) | ORAL | Status: DC | PRN

## 2014-04-02 MED ORDER — OXYTOCIN 10 UNIT/ML IJ SOLN
INTRAMUSCULAR | Status: AC
  Filled 2014-04-02: qty 4

## 2014-04-02 MED ORDER — ONDANSETRON HCL 4 MG/2ML IJ SOLN: 4.0000 mg | INTRAMUSCULAR | Status: DC | PRN

## 2014-04-02 MED ORDER — MORPHINE SULFATE 0.5 MG/ML IJ SOLN
INTRAMUSCULAR | Status: AC
  Filled 2014-04-02: qty 10

## 2014-04-02 MED ORDER — LACTATED RINGERS IV SOLN
INTRAVENOUS | Status: DC | PRN
  Administered 2014-04-02: 02:00:00 via INTRAVENOUS

## 2014-04-02 MED ORDER — DIBUCAINE 1 % RE OINT: 1.0000 "application " | TOPICAL_OINTMENT | RECTAL | Status: DC | PRN

## 2014-04-02 MED ORDER — HYDROMORPHONE HCL PF 1 MG/ML IJ SOLN
0.2500 mg | INTRAMUSCULAR | Status: DC | PRN
  Administered 2014-04-02: 0.5 mg via INTRAVENOUS

## 2014-04-02 MED ORDER — SIMETHICONE 80 MG PO CHEW: 80.0000 mg | CHEWABLE_TABLET | ORAL | Status: DC | PRN

## 2014-04-02 MED ORDER — METOCLOPRAMIDE HCL 5 MG/ML IJ SOLN: 10.0000 mg | Freq: Three times a day (TID) | INTRAMUSCULAR | Status: DC | PRN

## 2014-04-02 MED ORDER — DEXTROSE 5 % IV SOLN: 3.0000 g | INTRAVENOUS | Status: DC | PRN

## 2014-04-02 MED ORDER — PROMETHAZINE HCL 25 MG/ML IJ SOLN: 6.2500 mg | INTRAMUSCULAR | Status: DC | PRN

## 2014-04-02 MED ORDER — SODIUM CHLORIDE 0.9 % IJ SOLN: 3.0000 mL | INTRAMUSCULAR | Status: DC | PRN

## 2014-04-02 MED ORDER — MENTHOL 3 MG MT LOZG: 1.0000 | LOZENGE | OROMUCOSAL | Status: DC | PRN

## 2014-04-02 MED ORDER — DIPHENHYDRAMINE HCL 50 MG/ML IJ SOLN
12.5000 mg | INTRAMUSCULAR | Status: DC | PRN
Start: 2014-04-02 — End: 2014-04-06

## 2014-04-02 MED ORDER — CEFAZOLIN SODIUM-DEXTROSE 2-3 GM-% IV SOLR
2.0000 g | Freq: Once | INTRAVENOUS | Status: DC
  Filled 2014-04-02: qty 50

## 2014-04-02 MED ORDER — LACTATED RINGERS IV SOLN
INTRAVENOUS | Status: DC
  Administered 2014-04-02: 13:00:00 via INTRAVENOUS

## 2014-04-02 MED ORDER — MORPHINE SULFATE (PF) 0.5 MG/ML IJ SOLN
INTRAMUSCULAR | Status: DC | PRN
  Administered 2014-04-02: 4 mg via EPIDURAL
  Administered 2014-04-02: 1 mg via EPIDURAL

## 2014-04-02 MED ORDER — PRENATAL MULTIVITAMIN CH
1.0000 | ORAL_TABLET | Freq: Every day | ORAL | Status: DC
  Administered 2014-04-03 – 2014-04-05 (×3): 1 via ORAL
  Filled 2014-04-02 (×3): qty 1

## 2014-04-02 MED ORDER — ONDANSETRON HCL 4 MG/2ML IJ SOLN
INTRAMUSCULAR | Status: DC | PRN
  Administered 2014-04-02: 4 mg via INTRAVENOUS

## 2014-04-02 MED ORDER — LANOLIN HYDROUS EX OINT: 1.0000 "application " | TOPICAL_OINTMENT | CUTANEOUS | Status: DC | PRN

## 2014-04-02 MED ORDER — SODIUM BICARBONATE 8.4 % IV SOLN
INTRAVENOUS | Status: AC
  Filled 2014-04-02: qty 50

## 2014-04-02 MED ORDER — SIMETHICONE 80 MG PO CHEW
80.0000 mg | CHEWABLE_TABLET | Freq: Three times a day (TID) | ORAL | Status: DC
  Administered 2014-04-02 – 2014-04-05 (×12): 80 mg via ORAL
  Filled 2014-04-02 (×10): qty 1

## 2014-04-02 MED ORDER — HYDROMORPHONE HCL PF 1 MG/ML IJ SOLN
INTRAMUSCULAR | Status: AC
  Filled 2014-04-02: qty 1

## 2014-04-02 MED ORDER — OXYCODONE-ACETAMINOPHEN 5-325 MG PO TABS
1.0000 | ORAL_TABLET | ORAL | Status: DC | PRN
  Administered 2014-04-02 – 2014-04-06 (×9): 1 via ORAL
  Filled 2014-04-02 (×9): qty 1

## 2014-04-02 MED ORDER — SIMETHICONE 80 MG PO CHEW
80.0000 mg | CHEWABLE_TABLET | ORAL | Status: DC
  Administered 2014-04-02 – 2014-04-05 (×3): 80 mg via ORAL
  Filled 2014-04-02 (×3): qty 1

## 2014-04-02 MED ORDER — WITCH HAZEL-GLYCERIN EX PADS: 1.0000 "application " | MEDICATED_PAD | CUTANEOUS | Status: DC | PRN

## 2014-04-02 MED ORDER — OXYTOCIN 40 UNITS IN LACTATED RINGERS INFUSION - SIMPLE MED: 62.5000 mL/h | INTRAVENOUS | Status: AC

## 2014-04-02 MED ORDER — LIDOCAINE-EPINEPHRINE (PF) 2 %-1:200000 IJ SOLN
INTRAMUSCULAR | Status: AC
  Filled 2014-04-02: qty 20

## 2014-04-02 MED ORDER — SCOPOLAMINE 1 MG/3DAYS TD PT72
MEDICATED_PATCH | TRANSDERMAL | Status: AC
  Filled 2014-04-02: qty 1

## 2014-04-02 SURGICAL SUPPLY — 39 items
ADH SKN CLS APL DERMABOND .7 (GAUZE/BANDAGES/DRESSINGS) ×1
APL SKNCLS STERI-STRIP NONHPOA (GAUZE/BANDAGES/DRESSINGS) ×1
BENZOIN TINCTURE PRP APPL 2/3 (GAUZE/BANDAGES/DRESSINGS) ×2 IMPLANT
BLADE SURG 10 STRL SS (BLADE) ×2 IMPLANT
CLAMP CORD UMBIL (MISCELLANEOUS) IMPLANT
CLOSURE WOUND 1/2 X4 (GAUZE/BANDAGES/DRESSINGS) ×1
CLOTH BEACON ORANGE TIMEOUT ST (SAFETY) ×3 IMPLANT
DERMABOND ADVANCED (GAUZE/BANDAGES/DRESSINGS) ×2
DERMABOND ADVANCED .7 DNX12 (GAUZE/BANDAGES/DRESSINGS) IMPLANT
DRAPE LG THREE QUARTER DISP (DRAPES) IMPLANT
DRSG OPSITE POSTOP 4X10 (GAUZE/BANDAGES/DRESSINGS) ×3 IMPLANT
DURAPREP 26ML APPLICATOR (WOUND CARE) ×3 IMPLANT
ELECT REM PT RETURN 9FT ADLT (ELECTROSURGICAL) ×3
ELECTRODE REM PT RTRN 9FT ADLT (ELECTROSURGICAL) ×1 IMPLANT
EXTRACTOR VACUUM KIWI (MISCELLANEOUS) IMPLANT
GLOVE BIO SURGEON STRL SZ 6.5 (GLOVE) ×2 IMPLANT
GLOVE BIO SURGEONS STRL SZ 6.5 (GLOVE) ×1
GOWN STRL REUS W/TWL LRG LVL3 (GOWN DISPOSABLE) ×6 IMPLANT
KIT ABG SYR 3ML LUER SLIP (SYRINGE) IMPLANT
NDL HYPO 25X5/8 SAFETYGLIDE (NEEDLE) IMPLANT
NEEDLE HYPO 25X5/8 SAFETYGLIDE (NEEDLE) IMPLANT
NS IRRIG 1000ML POUR BTL (IV SOLUTION) ×3 IMPLANT
PACK C SECTION WH (CUSTOM PROCEDURE TRAY) ×3 IMPLANT
PAD OB MATERNITY 4.3X12.25 (PERSONAL CARE ITEMS) ×3 IMPLANT
RTRCTR C-SECT PINK 25CM LRG (MISCELLANEOUS) ×3 IMPLANT
STRIP CLOSURE SKIN 1/2X4 (GAUZE/BANDAGES/DRESSINGS) ×1 IMPLANT
SUT CHROMIC 1 CTX 36 (SUTURE) ×6 IMPLANT
SUT PLAIN 0 NONE (SUTURE) IMPLANT
SUT PLAIN 2 0 XLH (SUTURE) ×3 IMPLANT
SUT VIC AB 0 CT1 27 (SUTURE) ×6
SUT VIC AB 0 CT1 27XBRD ANBCTR (SUTURE) ×2 IMPLANT
SUT VIC AB 2-0 CT1 27 (SUTURE) ×3
SUT VIC AB 2-0 CT1 TAPERPNT 27 (SUTURE) ×1 IMPLANT
SUT VIC AB 3-0 CT1 27 (SUTURE)
SUT VIC AB 3-0 CT1 TAPERPNT 27 (SUTURE) IMPLANT
SUT VIC AB 4-0 KS 27 (SUTURE) ×3 IMPLANT
TOWEL OR 17X24 6PK STRL BLUE (TOWEL DISPOSABLE) ×3 IMPLANT
TRAY FOLEY CATH 14FR (SET/KITS/TRAYS/PACK) ×3 IMPLANT
WATER STERILE IRR 1000ML POUR (IV SOLUTION) ×3 IMPLANT

## 2014-04-02 NOTE — Progress Notes (Signed)
Subjective: Postpartum Day0: Cesarean Delivery Patient reports tolerating PO.  Pain is controlled  Objective: Vital signs in last 24 hours: Temp:  [98 F (36.7 C)-99.1 F (37.3 C)] 99.1 F (37.3 C) (08/27 0700) Pulse Rate:  [73-102] 83 (08/27 0700) Resp:  [12-22] 20 (08/27 0700) BP: (98-140)/(48-89) 118/72 mmHg (08/27 0700) SpO2:  [97 %-100 %] 98 % (08/27 0700)  Physical Exam:  General: alert and cooperative Lochia: appropriate Uterine Fundus: firm Incision: C/D/I    Recent Labs  03/31/14 2020  HGB 11.9*  HCT 35.1*    Assessment/Plan: Status post Cesarean section. Doing well postoperatively.  Continue current care.  Kathryn Moyer 04/02/2014, 8:04 AM

## 2014-04-02 NOTE — Addendum Note (Signed)
Addendum created 04/02/14 0925 by Earmon Phoenix, CRNA   Modules edited: Notes Section   Notes Section:  File: [id number]

## 2014-04-02 NOTE — Transfer of Care (Signed)
Immediate Anesthesia Transfer of Care Note  Patient: Kathryn Moyer  Procedure(s) Performed: Procedure(s): CESAREAN SECTION (N/A)  Patient Location: PACU  Anesthesia Type:Epidural  Level of Consciousness: awake, alert , oriented and patient cooperative  Airway & Oxygen Therapy: Patient Spontanous Breathing  Post-op Assessment: Report given to PACU RN, Post -op Vital signs reviewed and stable and Patient moving all extremities X 4  Post vital signs: Reviewed and stable  Complications: No apparent anesthesia complications

## 2014-04-02 NOTE — Op Note (Addendum)
Operative note  Preoperative diagnosis Term pregnancy at [redacted] weeks gestation Arrest of dilation at 6 cm Fetal heart rate decelerations  Postoperative diagnosis Same  Procedure Primary low transverse C-section with 2 layer closure of uterus  Anesthesia Epidural  Findings There was a viable female infant in the vertex presentation. There was moderate stained meconium fluid. Apgars were 7 and 8. Weight was pending at time of dictation. Uterus ovaries and tubes appeared normal.  Specimen Placenta was sent to L&D  Estimated blood loss 900 cc Urine output 100 cc IV fluid 2200 cc LR  Procedure note  Patient was taken to the operating room where epidural anesthesia was found to be adequate by Allis clamp test. She was prepped and draped in the normal sterile fashion in the dorsal supine position with a leftward tilt. An appropriate time out was performed. A Pfannenstiel skin incision was then made with the scalpel and carried through to the underlying layer of fascia by sharp dissection and Bovie cautery. The fascia was nicked in the midline and the incision was extended laterally with Mayo scissors.The superior aspect was dissected off the rectus muscles. Rectus muscles were separated in the midline and the peritoneal cavity entered bluntly. The peritoneal incision was then extended both superiorly and inferiorly with careful attention to avoid both bowel and bladder. The Alexis self-retaining wound retractor was then placed within the incision and the lower uterine segment exposed. The bladder flap was developed with Metzenbaum scissors and pushed away from the lower uterine segment. The lower uterine segment was then incised in a transverse fashion and the cavity itself entered bluntly. The incision was extended bluntly. The infant's head was then lifted and delivered from the incision without difficulty. The remainder of the infant delivered and the nose and mouth bulb suctioned with the cord  clamped and cut as well. The infant was handed off to the waiting pediatricians. The placenta was then spontaneously expressed from the uterus and the uterus cleared of all clots and debris with moist lap sponge. The uterine incision was then repaired in 2 layers the first layer was a running locked layer 1-0 chromic and the second an imbricating layer of the same suture. The tubes and ovaries were inspected and the gutters cleared of all clots and debris. The uterine incision was inspected and found to be hemostatic. All instruments and sponges as well as the Alexis retractor were then removed from the abdomen. The rectus muscles and peritoneum were then reapproximated with several interrupted mattress sutures of 2-0 Vicryl. The fascia was then closed with 0 Vicryl in a running fashion. Subcutaneous tissue was reapproximated with 3-0 plain in a running fashion. The skin was closed with a subcuticular stitch of 4-0 Vicryl on a Keith needle and then reinforced with Dermabond,  benzoin and Steri-Strips. At the conclusion of the procedure all instruments and sponge counts were correct. Patient was taken to the recovery room in good condition her baby remained with her for awhile, then was taken to the nursery for observation of 02 sat at 89-90%.

## 2014-04-02 NOTE — Progress Notes (Signed)
Patient ID: Kathryn Moyer, female   DOB: [birth year], 28 y.o.   MRN: [id number] CTSP for increasing FHR decelerations  Pt was redosed on her epidural and despite normal BP had a 4-5 minute deceleration to 80-90, which did eventually recover with bolus, O2 and position change We continue to labor and FHR decelerations have become more persistent and deep, with delayed recovery after the contraction. Cervix remains unchanged at 6cm and vertex is still high at -1 to 0 staiton with caput.   D/w pt I feel it is time to proceedwith c-section and she is in agreement.  Risks and benefits of bleeding, infection and possible damage to bowel and bladder d/w her.   Pitocin off and OR preparing.

## 2014-04-02 NOTE — Anesthesia Postprocedure Evaluation (Signed)
  Anesthesia Post-op Note  Patient: Kathryn Moyer  Procedure(s) Performed: Procedure(s): CESAREAN SECTION (N/A)  Patient Location: Mother/Baby  Anesthesia Type:Epidural  Level of Consciousness: awake, alert , oriented and patient cooperative  Airway and Oxygen Therapy: Patient Spontanous Breathing  Post-op Pain: mild  Post-op Assessment: Patient's Cardiovascular Status Stable, Respiratory Function Stable, No headache, No backache, No residual numbness and No residual motor weakness  Post-op Vital Signs: stable  Last Vitals:  Filed Vitals:   04/02/14 0700  BP: 118/72  Pulse: 83  Temp: 37.3 C  Resp: 20    Complications: No apparent anesthesia complications

## 2014-04-02 NOTE — Anesthesia Postprocedure Evaluation (Signed)
Anesthesia Post Note  Patient: Kathryn Moyer  Procedure(s) Performed: Procedure(s) (LRB): CESAREAN SECTION (N/A)  Anesthesia type: Epidural  Patient location: PACU  Post pain: Pain level controlled  Post assessment: Post-op Vital signs reviewed  Last Vitals:  Filed Vitals:   04/02/14 0207  BP:   Pulse:   Temp: 36.7 C  Resp:     Post vital signs: Reviewed  Level of consciousness: awake  Complications: No apparent anesthesia complications

## 2014-04-03 LAB — CBC
HCT: 28.3 % — ABNORMAL LOW (ref 36.0–46.0)
Hemoglobin: 9.5 g/dL — ABNORMAL LOW (ref 12.0–15.0)
MCH: 30.4 pg (ref 26.0–34.0)
MCHC: 33.6 g/dL (ref 30.0–36.0)
MCV: 90.4 fL (ref 78.0–100.0)
PLATELETS: 257 10*3/uL (ref 150–400)
RBC: 3.13 MIL/uL — AB (ref 3.87–5.11)
RDW: 14.8 % (ref 11.5–15.5)
WBC: 15 10*3/uL — AB (ref 4.0–10.5)

## 2014-04-03 LAB — BIRTH TISSUE RECOVERY COLLECTION (PLACENTA DONATION)

## 2014-04-03 NOTE — Lactation Note (Signed)
This note was copied from the chart of Kathryn Moyer. Lactation Consultation Note     Initial consult with this mom of a term NICU, in NICU after delivery with meconium stained fluid, and now requiring oxygen via HFNC, and is extremely tachypneaec, with respiratory acidosis. Mom very emotional, and was not able to receive lactation teaching at this time. iIset mom up for teaching and pumping, and told her I would check back with her later.   Patient Name: Kathryn Muriah Harsha [id number] Date: 04/03/2014     Maternal Data    Feeding    LATCH Score/Interventions                      Lactation Tools Discussed/Used     Consult Status      Alfred Levins 04/03/2014, 11:08 AM

## 2014-04-03 NOTE — Lactation Note (Signed)
This note was copied from the chart of Kathryn Kyasia Steuck. Lactation Consultation Note Follow up consult with this mom of a NICU baby, full term, and now 95 hours old. Mom has been pumping and still getting up to 5-6 mls of colostrum every 3 hours. She is aware of the [facility] loaner pump program, if she goes home before Monday. Mom is trying to stay as long  As she is able, to be with her baby.   Patient Name: Kathryn Moyer [id number] Date: 04/03/2014 Reason for consult: Follow-up assessment;NICU baby   Maternal Data    Feeding Feeding Type: Breast Milk with Formula added Length of feed: 15 min  LATCH Score/Interventions                      Lactation Tools Discussed/Used     Consult Status Consult Status: Follow-up Date: 04/04/14 Follow-up type: In-patient    Alfred Levins 04/03/2014, 5:44 PM

## 2014-04-03 NOTE — Lactation Note (Signed)
This note was copied from the chart of Kathryn Moyer. Lactation Consultation Note    Follow up consult with this mom of a NICU baby, now 96 hours old. Mom is still very emotional, saying she can not even visit her baby, it upsets her so much. Baby is still very tachypneac, and mom and dad have been kept up to date by baby's nurse,  NNP and MD's in NICU. Mom did allow me to show her how to do hand expression, and she was able to express some colostrum. I left the NICU book with mom, and told her I would do more teaching with her tomorrow. I showed her how to set the premie setting, and decreased mom to 21 flanges, with a much better fit.   Patient Name: Kathryn Moyer [id number] Date: 04/03/2014     Maternal Data    Feeding    LATCH Score/Interventions                      Lactation Tools Discussed/Used     Consult Status      Alfred Levins 04/03/2014, 11:13 AM

## 2014-04-03 NOTE — Progress Notes (Signed)
Subjective: Postpartum Day 2 Cesarean Delivery Patient reports incisional pain, tolerating PO and no problems voiding.    Objective: Vital signs in last 24 hours: Temp:  [98 F (36.7 C)-99.5 F (37.5 C)] 98.7 F (37.1 C) (08/28 0601) Pulse Rate:  [77-102] 80 (08/28 0601) Resp:  [16-20] 16 (08/28 0601) BP: (98-132)/(53-73) 120/65 mmHg (08/28 0601) SpO2:  [98 %-100 %] 100 % (08/28 0105)  Physical Exam:  General: alert and cooperative Lochia: appropriate Uterine Fundus: firm Incision: C/D/I    Recent Labs  03/31/14 2020 04/03/14 0557  HGB 11.9* 9.5*  HCT 35.1* 28.3*    Assessment/Plan: Status post Cesarean section. Doing well postoperatively.  Continue current care. Baby stable in NICU on antibiotics  Jil Penland W 04/03/2014, 9:04 AM

## 2014-04-04 ENCOUNTER — Encounter (HOSPITAL_COMMUNITY): Payer: Self-pay | Admitting: Obstetrics and Gynecology

## 2014-04-04 MED ORDER — MEASLES, MUMPS & RUBELLA VAC ~~LOC~~ INJ
0.5000 mL | INJECTION | Freq: Once | SUBCUTANEOUS | Status: DC
  Filled 2014-04-04: qty 0.5

## 2014-04-04 NOTE — Progress Notes (Signed)
Encouraged patient to use DEBP every three hours.

## 2014-04-04 NOTE — Progress Notes (Signed)
Subjective: Postpartum Day 2: Cesarean Delivery Patient reports incisional pain and tolerating PO.  Nl lochia, pain controlled  Objective: Vital signs in last 24 hours: Temp:  [98 F (36.7 C)-98.1 F (36.7 C)] 98 F (36.7 C) (08/29 0558) Pulse Rate:  [79-86] 79 (08/29 0558) Resp:  [18] 18 (08/29 0558) BP: (127-130)/(68-83) 127/68 mmHg (08/29 0558)  Physical Exam:  General: alert and no distress Lochia: appropriate Uterine Fundus: firm Incision: healing well DVT Evaluation: No evidence of DVT seen on physical exam.   Recent Labs  04/03/14 0557  HGB 9.5*  HCT 28.3*    Assessment/Plan: Status post Cesarean section. Doing well postoperatively.  Continue current care.  Baby in NICU on high-flow O2  Kathryn Moyer, Kathryn Moyer 04/04/2014, 8:36 AM

## 2014-04-04 NOTE — Lactation Note (Signed)
This note was copied from the chart of Kathryn Chalet Kerwin. Lactation Consultation Note Mom pumping at this time; milk is transitioning and increasing in volume. Mom is eager to hold and breastfeed baby as soon as he's medically stable enough to breast feed.   Patient Name: Kathryn Moyer Date: 04/04/2014     Maternal Data    Feeding Feeding Type: Breast Milk Length of feed: 20 min  LATCH Score/Interventions                      Lactation Tools Discussed/Used     Consult Status      Lenard Forth 04/04/2014, 1:42 PM

## 2014-04-05 MED ORDER — MEASLES, MUMPS & RUBELLA VAC ~~LOC~~ INJ: 0.5000 mL | INJECTION | Freq: Once | SUBCUTANEOUS | Status: DC

## 2014-04-05 NOTE — Progress Notes (Signed)
Clinical Social Work Department PSYCHOSOCIAL ASSESSMENT - MATERNAL/CHILD Dec 29, 2013  Patient:  Kathryn Moyer  Account Number:  [id number]  Admit Date:  October 10, 2013  Ardine Eng Name:   Kathryn Moyer    Clinical Social Worker:  Trayveon Beckford, LCSW   Date/Time:  May 19, 2014 11:00 AM  Date Referred:  08-06-14   Referral source  NICU     Referred reason  NICU   Other referral source:    I:  FAMILY / [place] legal guardian:  PARENT  Guardian - Name Guardian - Age Guardian - Address  Kathryn Moyer,Kathryn Moyer [address].  [place], [place] [address]  Kathryn Moyer  same as above   Other household support members/support persons Other support:    II  PSYCHOSOCIAL DATA Information Source:    Occupational hygienist Employment:   FOB is employed   Museum/gallery curator resources:  Multimedia programmer If [place]:   Other  [place] / Grade:   Maternity Care Coordinator / Child Services Coordination / Early Interventions:  Cultural issues impacting care:    III  STRENGTHS Strengths  Supportive family/friends  Home prepared for Child (including basic supplies)  Adequate Resources   Strength comment:    IV  RISK FACTORS AND CURRENT PROBLEMS Current Problem:       V  SOCIAL WORK ASSESSMENT Met with mother who was pleasant and receptive to social work intervention.  Parents are not married, but live together.   Mother have no other dependents.   FOB is employed and mother states that she is in school finishing her degree in music education.   Both parents seems to be coping well with newborn NICU admission.  Informed that they have spoken with the medical team, and feels comfortable with the care newborn's receiving.  Parents communicate hopes that infant will continue to do well. Mother denies any hx of substance abuse or mental illness. No acute social concerns related at this time.  CSW will follow PRN.      VI SOCIAL WORK PLAN Social Work Plan  No  Further Intervention Required / No Barriers to Discharge

## 2014-04-05 NOTE — Progress Notes (Signed)
Subjective: Postpartum Day 3: Cesarean Delivery Patient reports incisional pain and tolerating PO.  Nl lochia, pain controlled  Objective: Vital signs in last 24 hours: Temp:  [98 F (36.7 C)-98.4 F (36.9 C)] 98.4 F (36.9 C) (08/30 0626) Pulse Rate:  [87-90] 87 (08/30 0626) Resp:  [20] 20 (08/30 0626) BP: (131-136)/(77-80) 131/80 mmHg (08/30 0626) SpO2:  [100 %] 100 % (08/29 1900)  Physical Exam:  General: alert and no distress Lochia: appropriate Uterine Fundus: firm Incision: healing well DVT Evaluation: No evidence of DVT seen on physical exam.   Recent Labs  04/03/14 0557  HGB 9.5*  HCT 28.3*    Assessment/Plan: Status post Cesarean section. Doing well postoperatively.  Continue current care.  Plan for d/c tomorrow.    Bovard-Stuckert, Kathryn Moyer 04/05/2014, 7:26 AM

## 2014-04-05 NOTE — Lactation Note (Signed)
This note was copied from the chart of Kathryn Mckala Pantaleon. Lactation Consultation Note   Mother is pumping every 3 hours and getting good volume, 60-90 ml per pumping session.  Her breastmilk is transitioning. Reviewed labeling and milk storage and to be sure to hand express before and after pumping session. Mother has ordered breast pump from insurance.  Provided paperwork for insurance loaner. Answered questions and encouraged her to call if she needs further assistance.  Patient Name: Kathryn Moyer [id number]'[id number] Date: 04/05/2014     Maternal Data    Feeding Feeding Type: Breast Milk Length of feed: 30 min  LATCH Score/Interventions                      Lactation Tools Discussed/Used     Consult Status Consult Status: PRN    Hardie Pulley 04/05/2014, 9:48 AM

## 2014-04-06 MED ORDER — IBUPROFEN 600 MG PO TABS: 600.0000 mg | ORAL_TABLET | Freq: Four times a day (QID) | ORAL | Status: DC

## 2014-04-06 MED ORDER — OXYCODONE-ACETAMINOPHEN 5-325 MG PO TABS: 1.0000 | ORAL_TABLET | ORAL | Status: DC | PRN

## 2014-04-06 NOTE — Lactation Note (Signed)
This note was copied from the chart of Kathryn Moyer. Lactation Consultation Note     Follow up consult with this mom of a NICU baby, full term and now 67 days old. Mom is being discharged to home today. She is pumping up to 6 ounces every 3 hours. She rented a DEP for 2 weeks, until her Medela DEP arrives from inusuance. Instructed in use of pump, and discharge teaching on milk transport to NICU done. We discussed latching maximus in the NICU - mom to akskthat he go to breast before bottle, if possible. Mom knows to call for lactation assistance with latching, as needed, in the NICU.   Patient Name: Kathryn Teeghan Hammer [id number] Date: 04/06/2014 Reason for consult: Follow-up assessment;NICU baby   Maternal Data    Feeding Feeding Type: Breast Milk Length of feed: 35 min ( PO NG)  LATCH Score/Interventions                      Lactation Tools Discussed/Used     Consult Status Consult Status: PRN Follow-up type: In-patient (NICU)    Alfred Levins 04/06/2014, 9:48 AM

## 2014-04-06 NOTE — Discharge Summary (Signed)
Obstetric Discharge Summary Reason for Admission: induction of labor Prenatal Procedures: NST Intrapartum Procedures: cesarean: low cervical, transverse Postpartum Procedures: none Complications-Operative and Postpartum: none Hemoglobin  Date Value Ref Range Status  04/03/2014 9.5* 12.0 - 15.0 g/dL Final     HCT  Date Value Ref Range Status  04/03/2014 28.3* 36.0 - 46.0 % Final    Physical Exam:  General: alert and cooperative Lochia: appropriate Uterine Fundus: firm Incision: C/D/I   Discharge Diagnoses: Term Pregnancy-delivered                                         Meconiium sstained fluid                                        Arrest of dilation and NRFHT Discharge Information: Date: 04/06/2014 Activity: pelvic rest Diet: routine Medications: Ibuprofen and Percocet Condition: improved Instructions: refer to practice specific booklet Discharge to: home Follow-up Information   Follow up with Oliver Pila, MD. Schedule an appointment as soon as possible for a visit in 2 weeks. (incision check)    Specialty:  Obstetrics and Gynecology   Contact information:   [address] [place] [address] [phone]       Newborn Data: Live born female  Birth Weight: 7 lb 3.9 oz (3285 g) APGAR: 7, 8  Baby in NICU with meconium aspiration syndrome.  Improving but not ready for D/C  Kathryn Moyer W 04/06/2014, 9:05 AM

## 2014-04-06 NOTE — Progress Notes (Signed)
Subjective: Postpartum Day 4 Cesarean Delivery Patient reports incisional pain, tolerating PO, + flatus and no problems voiding.    Objective: Vital signs in last 24 hours: Temp:  [98.4 F (36.9 C)-98.5 F (36.9 C)] 98.5 F (36.9 C) (08/31 0558) Pulse Rate:  [88-101] 101 (08/31 0558) Resp:  [20] 20 (08/31 0558) BP: (128-136)/(80-84) 136/80 mmHg (08/31 0558)  Physical Exam:  General: alert and cooperative Lochia: appropriate Uterine Fundus: firm Incision: C/D/I   No results found for this basename: HGB, HCT,  in the last 72 hours  Assessment/Plan: Status post Cesarean section. Doing well postoperatively.  Discharge home with standard precautions and return to clinic in 2 weeks. Baby improving in NICU  Cambre Matson W 04/06/2014, 9:02 AM

## 2014-04-13 ENCOUNTER — Ambulatory Visit: Payer: Self-pay

## 2014-04-13 NOTE — Lactation Note (Signed)
This note was copied from the chart of Kathryn Leea Rambeau. Lactation Consultation Note    I called mom at home and set an appointment for lactation consult with mom at 3 pm today.  Patient Name: Kathryn Moyer [id number] Date: 04/13/2014 Reason for consult: Follow-up assessment   Maternal Data    Feeding Feeding Type: Breast Milk Nipple Type: Slow - flow Length of feed: 15 min  LATCH Score/Interventions                      Lactation Tools Discussed/Used     Consult Status Consult Status: Follow-up Date: 04/13/14 Follow-up type: In-patient    Kathryn Moyer 04/13/2014, 9:52 AM

## 2014-06-08 ENCOUNTER — Encounter (HOSPITAL_COMMUNITY): Payer: Self-pay | Admitting: Obstetrics and Gynecology

## 2014-09-10 ENCOUNTER — Emergency Department (HOSPITAL_COMMUNITY)
Admission: EM | Admit: 2014-09-10 | Discharge: 2014-09-10 | Disposition: A | Discharge status: Home / Self Care | Payer: Medicaid Other | Attending: Emergency Medicine | Admitting: Emergency Medicine

## 2014-09-10 ENCOUNTER — Encounter (HOSPITAL_COMMUNITY): Payer: Self-pay | Admitting: Emergency Medicine

## 2014-09-10 ENCOUNTER — Emergency Department (HOSPITAL_COMMUNITY): Payer: Medicaid Other

## 2014-09-10 DIAGNOSIS — Z792 Long term (current) use of antibiotics: Secondary | ICD-10-CM | POA: Insufficient documentation

## 2014-09-10 DIAGNOSIS — W1830XA Fall on same level, unspecified, initial encounter: Secondary | ICD-10-CM | POA: Diagnosis not present

## 2014-09-10 DIAGNOSIS — S99912A Unspecified injury of left ankle, initial encounter: Secondary | ICD-10-CM | POA: Diagnosis present

## 2014-09-10 DIAGNOSIS — Z791 Long term (current) use of non-steroidal anti-inflammatories (NSAID): Secondary | ICD-10-CM | POA: Diagnosis not present

## 2014-09-10 DIAGNOSIS — Y9389 Activity, other specified: Secondary | ICD-10-CM | POA: Diagnosis not present

## 2014-09-10 DIAGNOSIS — Z8619 Personal history of other infectious and parasitic diseases: Secondary | ICD-10-CM | POA: Insufficient documentation

## 2014-09-10 DIAGNOSIS — S93402A Sprain of unspecified ligament of left ankle, initial encounter: Secondary | ICD-10-CM | POA: Insufficient documentation

## 2014-09-10 DIAGNOSIS — Z87891 Personal history of nicotine dependence: Secondary | ICD-10-CM | POA: Insufficient documentation

## 2014-09-10 DIAGNOSIS — Y9289 Other specified places as the place of occurrence of the external cause: Secondary | ICD-10-CM | POA: Diagnosis not present

## 2014-09-10 DIAGNOSIS — Z79899 Other long term (current) drug therapy: Secondary | ICD-10-CM | POA: Diagnosis not present

## 2014-09-10 DIAGNOSIS — Y998 Other external cause status: Secondary | ICD-10-CM | POA: Insufficient documentation

## 2014-09-10 DIAGNOSIS — I998 Other disorder of circulatory system: Secondary | ICD-10-CM | POA: Diagnosis not present

## 2014-09-10 MED ORDER — IBUPROFEN 800 MG PO TABS: 800.0000 mg | ORAL_TABLET | Freq: Three times a day (TID) | ORAL | Status: DC

## 2014-09-10 MED ORDER — METHOCARBAMOL 500 MG PO TABS: 500.0000 mg | ORAL_TABLET | Freq: Two times a day (BID) | ORAL | Status: DC

## 2014-09-10 MED ORDER — IBUPROFEN 400 MG PO TABS
800.0000 mg | ORAL_TABLET | Freq: Once | ORAL | Status: AC
  Administered 2014-09-10: 800 mg via ORAL
  Filled 2014-09-10: qty 2

## 2014-09-10 NOTE — ED Notes (Signed)
Pt is in the bathroom.

## 2014-09-10 NOTE — ED Notes (Signed)
Pt sts tripped and fell and twisted left ankle and foot today; pt sts pain; CNS intact

## 2014-09-10 NOTE — Discharge Instructions (Signed)

## 2014-09-10 NOTE — ED Provider Notes (Signed)
CSN: [id number]     Arrival date & time 09/10/14  1227 History  This chart was scribed for non-physician practitioner, Fayrene Helper, PA-C, working with Hilario Quarry, MD, by Ronney Lion, ED Scribe. This patient was seen in room TR07C/TR07C and the patient's care was started at 12:49 PM.    Chief Complaint  Patient presents with  . Foot Pain   The history is provided by the patient. No language interpreter was used.     HPI Comments: Kathryn Moyer is a 29 y.o. female who presents to the Emergency Department complaining of a left ankle injury that occurred when patient mis-stepped on a step and twisted her ankle about 8 hours ago, at about 4:45 AM. She denies LOC or head injury. She complains of constant, sharp, throbbing, 6/10 ankle pain. Patient cannot bear weight on the ankle secondary to pain. Patient denies a history of injury to the same ankle. She has NKDA. She denies a possibility of pregnancy.    Past Medical History  Diagnosis Date  . Hx of varicella   . Medical history non-contributory    Past Surgical History  Procedure Laterality Date  . Anterior cruciate ligament repair      R klnee  . No past surgeries    . Cesarean section N/A 04/02/2014    Procedure: CESAREAN SECTION;  Surgeon: Oliver Pila, MD;  Location: [facility];  Service: Obstetrics;  Laterality: N/A;   Family History  Problem Relation Age of Onset  . Hearing loss Paternal Grandmother     deaf  . Hearing loss Paternal Grandfather     deaf  . Diabetes Paternal Grandfather   . Hypertension Mother   . Hypertension Father   . Cancer Maternal Grandmother     lung  . Diabetes Maternal Grandfather    History  Substance Use Topics  . Smoking status: Former Smoker -- 0.00 packs/day for 4 years    Types: Cigarettes    Quit date: 08/31/2013  . Smokeless tobacco: Not on file  . Alcohol Use: No   OB History    Gravida Para Term Preterm AB TAB SAB Ectopic Multiple Living   Review of Systems   Constitutional: Negative for fever.  Musculoskeletal: Positive for arthralgias.      Allergies  Other  Home Medications   Prior to Admission medications   Medication Sig Start Date End Date Taking? Authorizing Provider  cetirizine (ZYRTEC) 10 MG tablet Take 10 mg by mouth daily as needed for allergies.    Historical Provider, MD  clindamycin-tretinoin Salli Quarry) gel Apply 1 application topically at bedtime as needed (For acne.).     Historical Provider, MD  ibuprofen (ADVIL,MOTRIN) 600 MG tablet Take 1 tablet (600 mg total) by mouth every 6 (six) hours. 04/06/14   Oliver Pila, MD  oxyCODONE-acetaminophen (PERCOCET/ROXICET) 5-325 MG per tablet Take 1-2 tablets by mouth every 4 (four) hours as needed for severe pain (moderate - severe pain). 04/06/14   Oliver Pila, MD  Prenatal Vit-Fe Fumarate-FA (PRENATAL MULTIVITAMIN) TABS tablet Take 1 tablet by mouth at bedtime.    Historical Provider, MD   BP 119/73 mmHg  Pulse 77  Temp(Src) 98.2 F (36.8 C) (Oral)  Resp 16  SpO2 100% Physical Exam  Constitutional: She is oriented to person, place, and time. She appears well-developed and well-nourished. No distress.  HENT:  Head: Normocephalic and atraumatic.  Eyes: Conjunctivae and EOM  are normal.  Neck: Neck supple. No tracheal deviation present.  Cardiovascular: Normal rate.   Pulmonary/Chest: Effort normal. No respiratory distress.  Musculoskeletal: Normal range of motion. She exhibits tenderness.  Tenderness to medial malleolar region of left ankle to palpation. Pain with plantar flexion. Normal dorsiflexion. Pain with foot inversion and eversion. No gross deformity noted. Pedal pulses noted. Left foot is normal.  Neurological: She is alert and oriented to person, place, and time.  Skin: Skin is warm and dry.  Psychiatric: She has a normal mood and affect. Her behavior is normal.  Nursing note and vitals reviewed.   ED Course  Procedures (including critical care  time)  DIAGNOSTIC STUDIES: Oxygen Saturation is 100% on room air, normal by my interpretation.    COORDINATION OF CARE: 12:53 PM - L ankle injury.  Pt is NVI.  Pt requesting xrays.  Discussed treatment plan with pt at bedside which includes left ankle XR, and pt agreed to plan.  2:07 PM - Informed patient of negative left ankle XR. Will place patient's left ankle in an ankle brace and discharge home. Patient verbalized understanding and agreed to plan.  Labs Review Labs Reviewed - No data to display  Imaging Review Dg Ankle Complete Left  09/10/2014   CLINICAL DATA:  Fall.  Pain to medial malleolus  EXAM: LEFT ANKLE COMPLETE - 3+ VIEW  COMPARISON:  None.  FINDINGS: There is no evidence of fracture, dislocation, or joint effusion. There is no evidence of arthropathy or other focal bone abnormality. Mild diffuse soft tissue swelling.  IMPRESSION: 1. No acute bone abnormality. 2. Soft tissue swelling   Electronically Signed   By: Signa Kellaylor  Stroud M.D.   On: 09/10/2014 14:04   Dg Foot Complete Left  09/10/2014   CLINICAL DATA:  Acute left foot pain after fall today. Initial encounter.  EXAM: LEFT FOOT - COMPLETE 3+ VIEW  COMPARISON:  None.  FINDINGS: There is no evidence of fracture or dislocation. There is no evidence of arthropathy or other focal bone abnormality. Soft tissues are unremarkable.  IMPRESSION: Normal left foot.   Electronically Signed   By: Roque LiasJames  Green M.D.   On: 09/10/2014 14:03     EKG Interpretation None      MDM   Final diagnoses:  Left ankle sprain, initial encounter   I have reviewed nursing notes and vital signs. I personally reviewed the imaging tests through PACS system  I reviewed available ER/hospitalization records thought the EMR   I personally performed the services described in this documentation, which was scribed in my presence. The recorded information has been reviewed and is accurate.      Fayrene HelperBowie Anaid Haney, PA-C 09/10/14 1411  Hilario Quarryanielle S Ray,  MD 09/10/14 [phone]

## 2015-05-21 ENCOUNTER — Ambulatory Visit (INDEPENDENT_AMBULATORY_CARE_PROVIDER_SITE_OTHER): Payer: Medicaid Other

## 2015-05-21 ENCOUNTER — Ambulatory Visit (INDEPENDENT_AMBULATORY_CARE_PROVIDER_SITE_OTHER): Payer: Medicaid Other | Admitting: Podiatry

## 2015-05-21 ENCOUNTER — Encounter: Payer: Self-pay | Admitting: Podiatry

## 2015-05-21 VITALS — BP 107/67 | HR 86 | Resp 16 | Ht 66.0 in | Wt 204.0 lb

## 2015-05-21 DIAGNOSIS — B070 Plantar wart: Secondary | ICD-10-CM

## 2015-05-21 DIAGNOSIS — L84 Corns and callosities: Secondary | ICD-10-CM

## 2015-05-21 DIAGNOSIS — M216X9 Other acquired deformities of unspecified foot: Secondary | ICD-10-CM

## 2015-05-21 DIAGNOSIS — M79673 Pain in unspecified foot: Secondary | ICD-10-CM

## 2015-05-21 DIAGNOSIS — B078 Other viral warts: Secondary | ICD-10-CM

## 2015-05-21 DIAGNOSIS — B079 Viral wart, unspecified: Secondary | ICD-10-CM

## 2015-05-21 NOTE — Progress Notes (Signed)
   Subjective:    Patient ID: Kathryn Moyer, female    DOB: 11/15/1985, 29 y.o.   MRN: [id number]  HPI Comments: "I have these calluses are really hurting me"  Patient presents with: Foot Pain: Plantar forefoot bilateral - callused areas x 4, extremely painful when walking, can't wear certain shoes comfortably, gets regular to trim and files them at home       Review of Systems  Neurological: Positive for headaches.  All other systems reviewed and are negative.      Objective:   Physical Exam        Assessment & Plan:

## 2015-05-23 NOTE — Progress Notes (Signed)
Subjective:     Patient ID: Kathryn Moyer, female   DOB: October 19, 1985, 29 y.o.   MRN: [id number]  HPI patient presents with severe lesions plantar aspect both feet that are very tender and making walking difficult. Patient wants to get a job at these are keeping her from being able to do it currently   Review of Systems  All other systems reviewed and are negative.      Objective:   Physical Exam  Constitutional: She is oriented to person, place, and time.  Cardiovascular: Intact distal pulses.   Musculoskeletal: Normal range of motion.  Neurological: She is oriented to person, place, and time.  Skin: Skin is warm.  Nursing note and vitals reviewed.  neurovascular status intact muscle strength adequate range of motion within normal limits with severe lesions noted on the plantar aspect of the metatarsal fourth bilateral with also first bilateral involved. 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 I explained they will come back and ultimately this may require surgery but there would be absolutely no long-term guarantees that surgery would be of benefit to the patient. Patient will be seen back for us to recheck and decision can be made

## 2015-08-25 ENCOUNTER — Ambulatory Visit (INDEPENDENT_AMBULATORY_CARE_PROVIDER_SITE_OTHER): Payer: Medicaid Other | Admitting: Podiatry

## 2015-08-25 ENCOUNTER — Encounter: Payer: Self-pay | Admitting: Podiatry

## 2015-08-25 VITALS — BP 113/73 | HR 83 | Resp 16

## 2015-08-25 DIAGNOSIS — M7750 Other enthesopathy of unspecified foot: Secondary | ICD-10-CM

## 2015-08-25 DIAGNOSIS — M216X9 Other acquired deformities of unspecified foot: Secondary | ICD-10-CM

## 2015-08-25 DIAGNOSIS — L84 Corns and callosities: Secondary | ICD-10-CM | POA: Diagnosis not present

## 2015-08-25 DIAGNOSIS — M779 Enthesopathy, unspecified: Secondary | ICD-10-CM

## 2015-08-26 NOTE — Progress Notes (Signed)
Subjective:     Patient ID: Kathryn Moyer, female   DOB: Jan 27, 1986, 30 y.o.   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 Will be seen back and ultimately will probably require surgery

## 2016-05-23 ENCOUNTER — Encounter: Payer: Self-pay | Admitting: Podiatry

## 2016-05-23 ENCOUNTER — Ambulatory Visit (INDEPENDENT_AMBULATORY_CARE_PROVIDER_SITE_OTHER): Payer: Medicaid Other | Admitting: Podiatry

## 2016-05-23 VITALS — BP 133/80 | HR 63 | Resp 14

## 2016-05-23 DIAGNOSIS — L84 Corns and callosities: Secondary | ICD-10-CM | POA: Diagnosis not present

## 2016-05-23 DIAGNOSIS — M216X9 Other acquired deformities of unspecified foot: Secondary | ICD-10-CM

## 2016-05-23 NOTE — Patient Instructions (Signed)
Corns and Calluses Corns are small areas of thickened skin that occur on the top, sides, or tip of a toe. They contain a cone-shaped core with a point that can press on a nerve below. This causes pain. Calluses are areas of thickened skin that can occur anywhere on the body including hands, fingers, palms, soles of the feet, and heels.Calluses are usually larger than corns.  CAUSES  Corns and calluses are caused by rubbing (friction) or pressure, such as from shoes that are too tight or do not fit properly.  RISK FACTORS Corns are more likely to develop in people who have toe deformities, such as hammer toes. Since calluses can occur with friction to any area of the skin, calluses are more likely to develop in people who:   Work with their hands.  Wear shoes that fit poorly, shoes that are too tight, or shoes that are high-heeled.  Have toes deformities. SYMPTOMS Symptoms of a corn or callus include:  A hard growth on the skin.   Pain or tenderness under the skin.   Redness and swelling.   Increased discomfort while wearing tight-fitting shoes. DIAGNOSIS  Corns and calluses may be diagnosed with a medical history and physical exam.  TREATMENT  Corns and calluses may be treated with:  Removing the cause of the friction or pressure. This may include:  Changing your shoes.  Wearing shoe inserts (orthotics) or other protective layers in your shoes, such as a corn pad.  Wearing gloves.  Medicines to help soften skin in the hardened, thickened areas.  Reducing the size of the corn or callus by removing the dead layers of skin.  Antibiotic medicines to treat infection.  Surgery, if a toe deformity is the cause. HOME CARE INSTRUCTIONS   Take medicines only as directed by your health care provider.  If you were prescribed an antibiotic, finish all of it even if you start to feel better.  Wear shoes that fit well. Avoid wearing high-heeled shoes and shoes that are too tight  or too loose.  Wear any padding, protective layers, gloves, or orthotics as directed by your health care provider.  Soak your hands or feet and then use a file or pumice stone to soften your corn or callus. Do this as directed by your health care provider.  Check your corn or callus every day for signs of infection. Watch for:  Redness, swelling, or pain.  Fluid, blood, or pus. SEEK MEDICAL CARE IF:   Your symptoms do not improve with treatment.  You have increased redness, swelling, or pain at the site of your corn or callus.  You have fluid, blood, or pus coming from your corn or callus.  You have new symptoms.   This information is not intended to replace advice given to you by your health care provider. Make sure you discuss any questions you have with your health care provider.   Document Released: 04/29/2004 Document Revised: 12/08/2014 Document Reviewed: 07/20/2014 Elsevier Interactive Patient Education 2016 [employer]  

## 2016-05-23 NOTE — Progress Notes (Signed)
Patient ID: Wendall PapaKrystal Forget, female   DOB: [birth year], 30 y.o.   MRN: [id number]   Subjective: This patient presents complaining of painful plantar calluses right greater than left symptomatic for multiple years with a history of previous debridement in our office. Also, patient complaining of pain in right bunion joint associated with increased standing walking recently  Objective: Orientated 3 DP and PT pulses 2/4 bilaterally Capillary reflex immediate bilaterally Sensation to 10 g monofilament wire intact 5/5 bilaterally Vibratory sensation reactive bilaterally Ankle reflex equal and reactive bilaterally Nucleated plantar keratoses fourth MPJ bilaterally Plantar callus first MPJ bilaterally HAV bilaterally  Assessment: Plantar hyperkeratoses with nucleated areas fourth MPJ bilaterally HAV bilaterally  Plan: Debride plantar keratoses 4 and apply salinocaine to fourth MPJ lesions The chest it over-the-counter callous removal or to the fourth MPJ callused areas Discussed possible surgical treatment for bunions if bunion pain persists  Reappoint at patient's request

## 2016-11-04 ENCOUNTER — Encounter (HOSPITAL_COMMUNITY): Payer: Self-pay | Admitting: Vascular Surgery

## 2016-11-04 ENCOUNTER — Emergency Department (HOSPITAL_COMMUNITY)
Admission: EM | Admit: 2016-11-04 | Discharge: 2016-11-04 | Disposition: A | Discharge status: Home / Self Care | Payer: Medicaid Other | Attending: Emergency Medicine | Admitting: Emergency Medicine

## 2016-11-04 DIAGNOSIS — R220 Localized swelling, mass and lump, head: Secondary | ICD-10-CM | POA: Insufficient documentation

## 2016-11-04 DIAGNOSIS — K0889 Other specified disorders of teeth and supporting structures: Secondary | ICD-10-CM | POA: Diagnosis present

## 2016-11-04 DIAGNOSIS — Z87891 Personal history of nicotine dependence: Secondary | ICD-10-CM | POA: Insufficient documentation

## 2016-11-04 DIAGNOSIS — K0510 Chronic gingivitis, plaque induced: Secondary | ICD-10-CM

## 2016-11-04 MED ORDER — IBUPROFEN 800 MG PO TABS: 800.0000 mg | ORAL_TABLET | Freq: Four times a day (QID) | ORAL | 0 refills | Status: AC | PRN

## 2016-11-04 MED ORDER — IBUPROFEN 800 MG PO TABS
800.0000 mg | ORAL_TABLET | Freq: Once | ORAL | Status: AC
  Administered 2016-11-04: 800 mg via ORAL
  Filled 2016-11-04: qty 1

## 2016-11-04 MED ORDER — ACETAMINOPHEN ER 650 MG PO TBCR: 650.0000 mg | EXTENDED_RELEASE_TABLET | Freq: Three times a day (TID) | ORAL | 0 refills | Status: DC | PRN

## 2016-11-04 MED ORDER — BENZOCAINE 20 % MT GEL: 1.0000 "application " | Freq: Two times a day (BID) | OROMUCOSAL | 0 refills | Status: DC | PRN

## 2016-11-04 NOTE — ED Provider Notes (Signed)
[facility]-EMER[facility]ENCY DEPT Provider Note   CSN: [id number] Arrival date & time: 11/04/16  1953     History   Chief Complaint Chief Complaint  Patient presents with  . Dental Pain    HPI Kathryn Moyer is a 31 y.o. female presents to the ED reporting left lower dental pain associated with mild swelling 3 days. No associated fevers, facial swelling, gumline bleeding or discharge, trismus, difficulty swallowing oral secretions. Patient reports that she had in upper left tooth extracted 4 days ago, she notes that it was "difficult extraction", was discharged Without antibiotics or pain medications. She notes that the site of extraction is healing well, is not swollen or painful. Patient reports to the ED to make sure that "I don't have a tooth infection". She has been taking ibuprofen with mild relief of her pain. Patient denies trauma to this tooth.  No history of diabetes, no use of tobacco.  HPI  Past Medical History:  Diagnosis Date  . Hx of varicella   . Medical history non-contributory     Patient Active Problem List   Diagnosis Date Noted  . S/P C-section 04/02/2014  . Normal pregnancy 03/31/2014    Past Surgical History:  Procedure Laterality Date  . ANTERIOR CRUCIATE LIGAMENT REPAIR     R klnee  . CESAREAN SECTION N/A 04/02/2014   Procedure: CESAREAN SECTION;  Surgeon: Oliver Pila, MD;  Location: [facility];  Service: Obstetrics;  Laterality: N/A;  . NO PAST SURGERIES      OB History    Gravida Para Term Preterm AB Living   SAB TAB Ectopic Multiple Live Births           1       Home Medications    Prior to Admission medications   Medication Sig Start Date End Date Taking? Authorizing Provider  acetaminophen (TYLENOL 8 HOUR) 650 MG CR tablet Take 1 tablet (650 mg total) by mouth every 8 (eight) hours as needed for pain. 11/04/16   Liberty Handy, PA-C  amoxicillin (AMOXIL) 500 MG tablet Take 500 mg by mouth 2 (two) times daily.    Historical  Provider, MD  benzocaine (HURRICAINE) 20 % GEL Use as directed 1 application in the mouth or throat 2 (two) times daily as needed. 11/04/16   Liberty Handy, PA-C  ibuprofen (ADVIL,MOTRIN) 800 MG tablet Take 1 tablet (800 mg total) by mouth every 6 (six) hours as needed. 11/04/16 11/14/16  Liberty Handy, PA-C  norethindrone-ethinyl estradiol-iron (MICROGESTIN FE,GILDESS FE,LOESTRIN FE) 1.5-30 MG-MCG tablet Take 1 tablet by mouth daily.    Historical Provider, MD    Family History Family History  Problem Relation Age of Onset  . Hearing loss Paternal Grandmother     deaf  . Hearing loss Paternal Grandfather     deaf  . Diabetes Paternal Grandfather   . Hypertension Mother   . Hypertension Father   . Cancer Maternal Grandmother     lung  . Diabetes Maternal Grandfather     Social History Social History  Substance Use Topics  . Smoking status: Former Smoker    Packs/day: 0.00    Years: 4.00    Types: Cigarettes    Quit date: 08/31/2013  . Smokeless tobacco: Never Used  . Alcohol use No     Allergies   Other   Review of Systems Review of Systems  Constitutional: Negative for fever.  HENT: Positive for dental problem.  Negative for drooling, facial swelling, sinus pain and trouble swallowing.   Neurological: Negative for headaches.     Physical Exam Updated Vital Signs BP 120/65 (BP Location: Right Arm)   Pulse 78   Temp 98.9 F (37.2 C) (Oral)   Resp 18   Ht  (1.702 m)   Wt 88.5 kg   SpO2 98%   BMI 30.54 kg/m   Physical Exam  Constitutional: She is oriented to person, place, and time. She appears well-developed and well-nourished. She is cooperative. No distress.  HENT:  Head: Normocephalic and atraumatic.  Nose: Nose normal.  Mouth/Throat: Abnormal dentition.    Very mild gum line erythema and edema associated with tenderness consistent with gingivitis without signs of fluctuance.  No dental abscess noted in this area. 4 extractions sites, all  without signs of infection. No facial or anterior neck edema, erythema or erythema. No sublingual edema or tenderness.  Soft palate flat without tenderness.  No trismus.   No pooling of oral secretions.  Phonation normal, no hot potato voice.  Maxilla and mandible nontender. Mastoids without edema, erythema or tenderness.    Eyes: Conjunctivae and EOM are normal. No scleral icterus.  Cardiovascular: Normal rate, regular rhythm, normal heart sounds and intact distal pulses.   No murmur heard. Pulmonary/Chest: Effort normal and breath sounds normal. She has no wheezes.  Musculoskeletal: Normal range of motion. She exhibits no deformity.  Neurological: She is alert and oriented to person, place, and time.  Skin: Skin is warm and dry. Capillary refill takes less than 2 seconds.  Psychiatric: She has a normal mood and affect. Her behavior is normal. Judgment and thought content normal.  Nursing note and vitals reviewed.    ED Treatments / Results  Labs (all labs ordered are listed, but only abnormal results are displayed) Labs Reviewed - No data to display  EKG  EKG Interpretation None       Radiology No results found.  Procedures Procedures (including critical care time)  Medications Ordered in ED Medications - No data to display   Initial Impression / Assessment and Plan / ED Course  I have reviewed the triage vital signs and the nursing notes.  Pertinent labs & imaging results that were available during my care of the patient were reviewed by me and considered in my medical decision making (see chart for details).    No signs of dental abscess. Physical exam findings suggestive of gingival irritation most likely from chronic gingivitis. No indication for antibiotics at this time. No dental abscess amenable to incision and drainage. Patient advised to take ibuprofen and Tylenol for better pain control. We'll prescribed analgesic topical gel. Advised patient to improve  dental hygiene, loss and follow-up with dentist in 2-3 days if her symptoms do not resolve. ED return precautions given, patient is aware of red flag symptoms to monitor for that would suggest dental abscess and would warrant return to the emergency department. Patient agreeable to plan, verbalized understanding, was discharged in good condition with all questions and concerns addressed.   Final Clinical Impressions(s) / ED Diagnoses   Final diagnoses:  Localized inflammation of gingival margin    New Prescriptions New Prescriptions   ACETAMINOPHEN (TYLENOL 8 HOUR) 650 MG CR TABLET    Take 1 tablet (650 mg total) by mouth every 8 (eight) hours as needed for pain.   BENZOCAINE (HURRICAINE) 20 % GEL    Use as directed 1 application in the mouth or throat 2 (two) times  daily as needed.   IBUPROFEN (ADVIL,MOTRIN) 800 MG TABLET    Take 1 tablet (800 mg total) by mouth every 6 (six) hours as needed.     Liberty Handy, PA-C 11/04/16 2139    Margarita Grizzle, MD 11/07/16 [phone]

## 2016-11-04 NOTE — ED Notes (Signed)
Pt c/o tooth swelling to the bottom left. Pt states she may have an abscess. See providers assessment.

## 2016-11-04 NOTE — ED Notes (Signed)
Pt stable, understands discharge instructions, and reasons for return.   

## 2016-11-04 NOTE — ED Triage Notes (Signed)
Pt reports to the ED for eval of dental pain. She states that she had a tooth extracted from her upper jaw on Tuesday this past week and she states that it was a difficulty extraction. She states that now her lower jaw and teeth/gums are swollen and erythematous. She has taken Ibuprofen 800 mg without relief.

## 2016-11-04 NOTE — Discharge Instructions (Signed)
As we discussed your gum line is inflamed but not infected.  There are no signs of abscess or infection.   Please take ibuprofen + tylenol every 8 hours for inflammation and pain.  Use topical numbing gel.  Floss your teeth twice a day to remove plaque.  Follow up with your dentist if your symptoms do not resolve.  Return to ED if you noticed swelling, redness, discharge, facial swelling or difficulty opening/closing your jaw

## 2017-01-06 IMAGING — CR DG FOOT COMPLETE 3+V*L*
3 series · 3 of 3 positions shown · non-contrast
Comparison: None.

CLINICAL DATA: Acute left foot pain after fall today. Initial
encounter.

EXAM:
LEFT FOOT - COMPLETE 3+ VIEW

[foot ap]
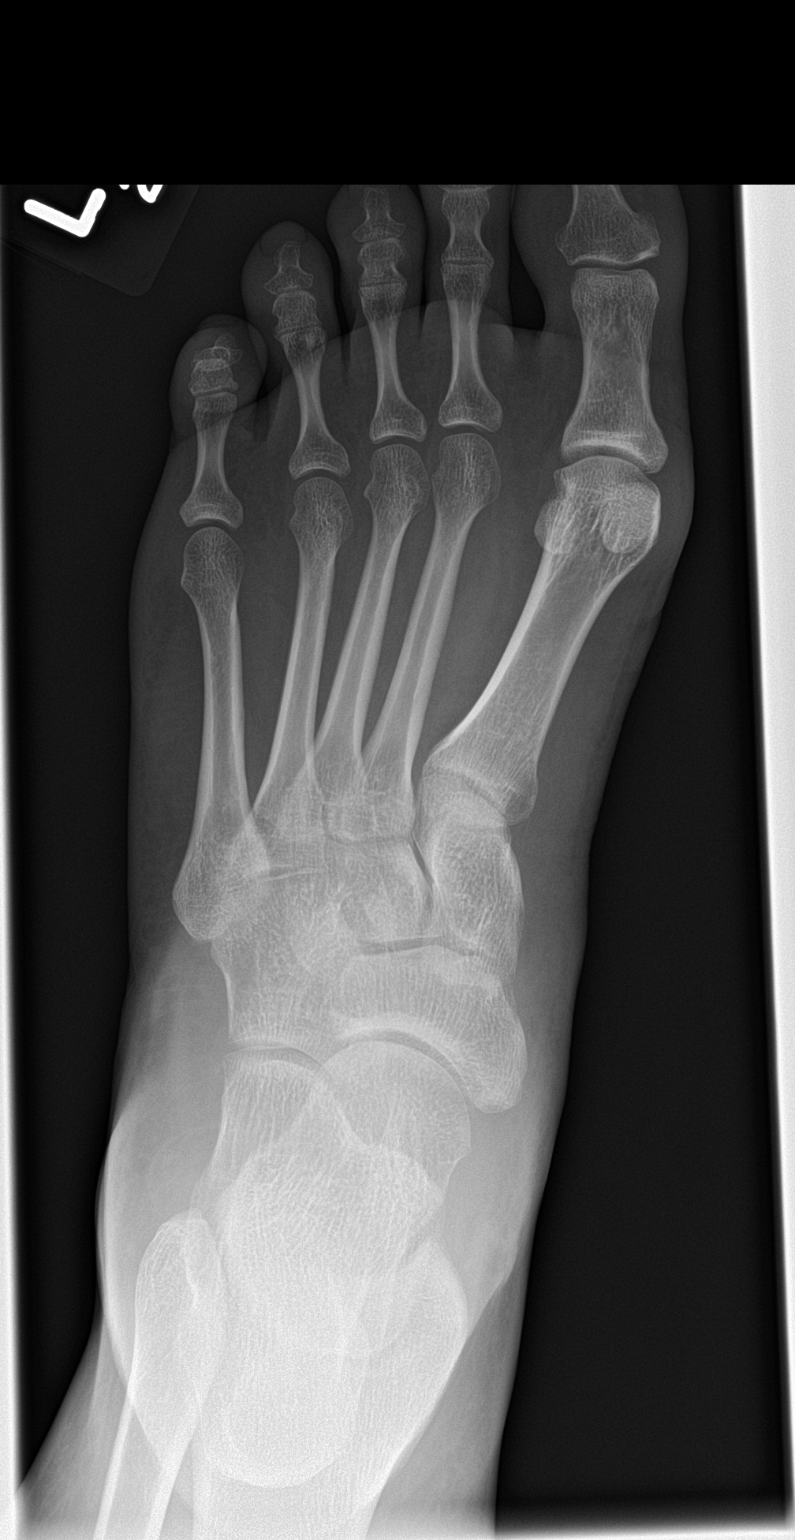

[foot obl]
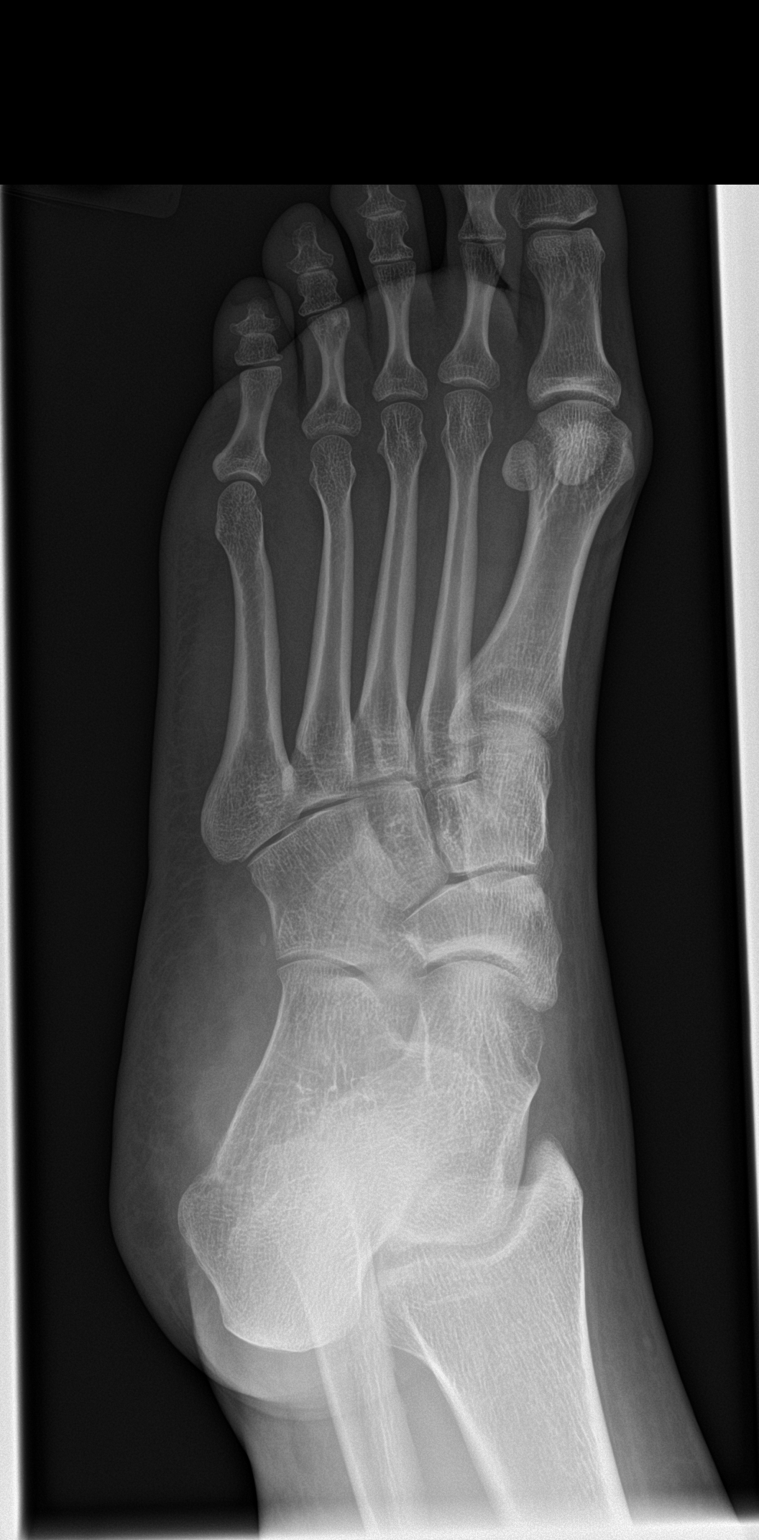

[foot lat]
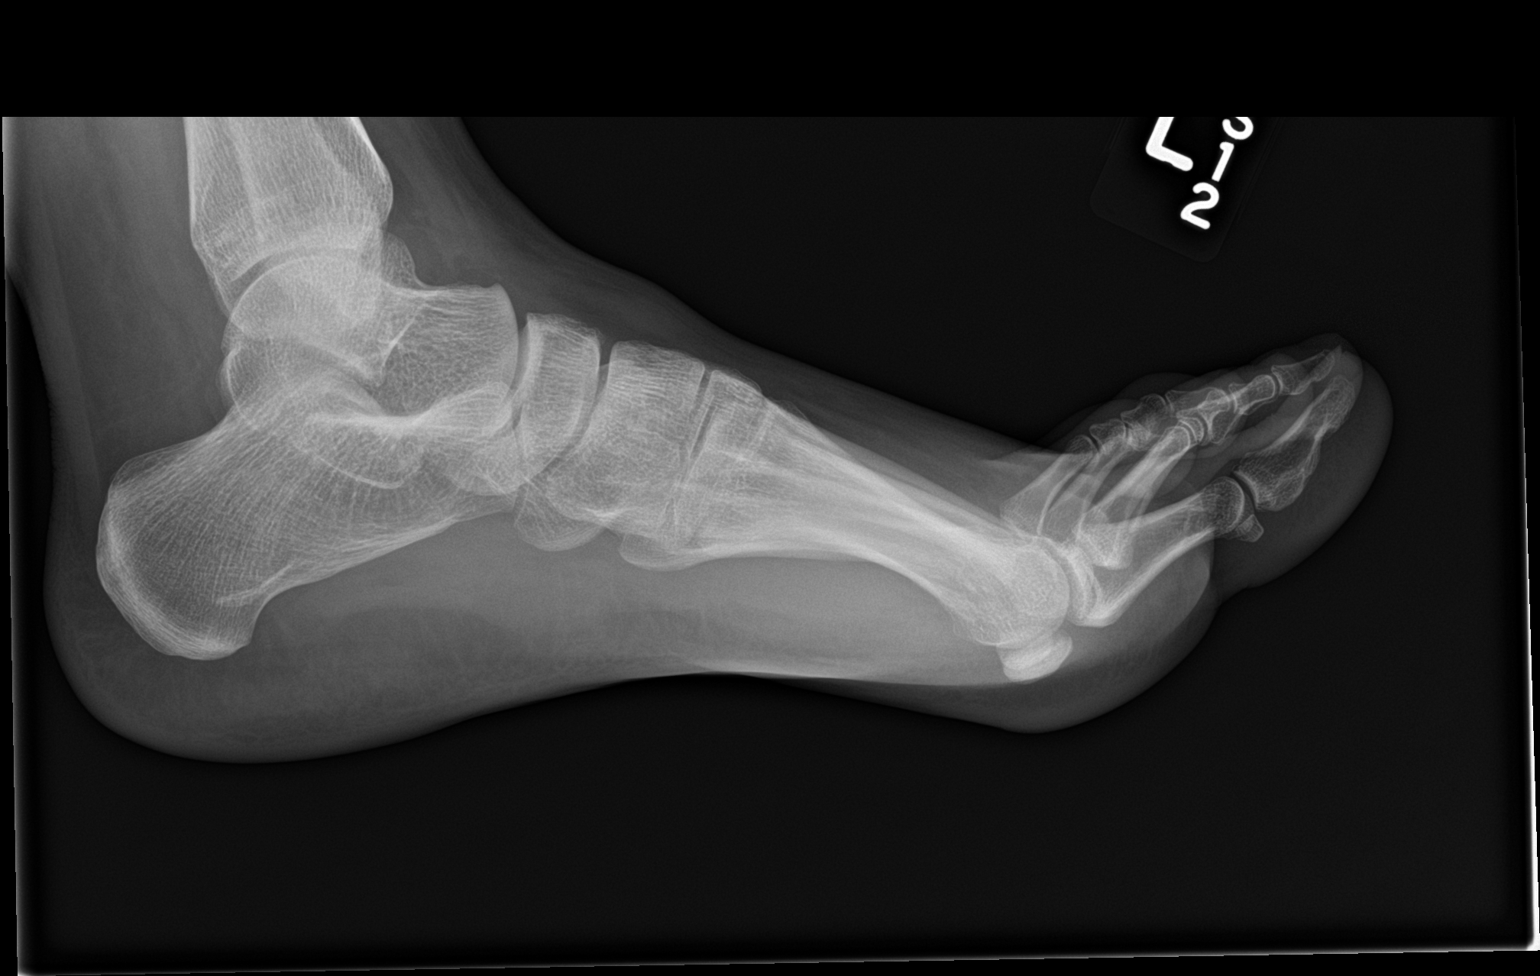

[3 of 3 positions shown; findings below may reference images not displayed]

FINDINGS: There is no evidence of fracture or dislocation. There is no
evidence of arthropathy or other focal bone abnormality. Soft
tissues are unremarkable.
IMPRESSION: Normal left foot.

## 2017-02-05 LAB — OB RESULTS CONSOLE RPR: RPR: NONREACTIVE

## 2017-02-05 LAB — OB RESULTS CONSOLE ABO/RH: RH Type: POSITIVE

## 2017-02-05 LAB — OB RESULTS CONSOLE HEPATITIS B SURFACE ANTIGEN: Hepatitis B Surface Ag: NEGATIVE

## 2017-02-05 LAB — OB RESULTS CONSOLE RUBELLA ANTIBODY, IGM: Rubella: IMMUNE

## 2017-02-05 LAB — OB RESULTS CONSOLE GC/CHLAMYDIA
Chlamydia: NEGATIVE
GC PROBE AMP, GENITAL: NEGATIVE

## 2017-02-05 LAB — OB RESULTS CONSOLE ANTIBODY SCREEN: ANTIBODY SCREEN: NEGATIVE

## 2017-02-05 LAB — OB RESULTS CONSOLE HIV ANTIBODY (ROUTINE TESTING): HIV: NONREACTIVE

## 2017-08-08 LAB — OB RESULTS CONSOLE GBS: STREP GROUP B AG: NEGATIVE

## 2017-08-23 ENCOUNTER — Encounter (HOSPITAL_COMMUNITY): Payer: Self-pay

## 2017-09-05 NOTE — Patient Instructions (Signed)
Kathryn Moyer  09/05/2017   Your procedure is scheduled on:  09/07/2017  [facility] through [facility] of [facility] at 0615 AM.  Pick up the phone at the desk and dial [phone]  Call this number if you have problems the morning of surgery:[phone]  Remember:   Do not eat food:(After Midnight) Desps de medianoche.  Do not drink clear liquids: (After Midnight) Desps de medianoche.  Take these medicines the morning of surgery with A SIP OF WATER: none   Do not wear jewelry, make-up or nail polish.  Do not wear lotions, powders, or perfumes. Do not wear deodorant.  Do not shave 48 hours prior to surgery.  Do not bring valuables to the hospital.  [facility] is not   responsible for any belongings or valuables brought to the hospital.  Contacts, dentures or bridgework may not be worn into surgery.  Leave suitcase in the car. After surgery it may be brought to your room.  For patients admitted to the hospital, checkout time is 11:00 AM the day of              discharge.    N/A   Please read over the following fact sheets that you were given:   Surgical Site Infection Prevention

## 2017-09-06 ENCOUNTER — Encounter (HOSPITAL_COMMUNITY)
Admission: RE | Admit: 2017-09-06 | Discharge: 2017-09-06 | Disposition: A | Discharge status: Home / Self Care | Payer: Medicaid Other | Source: Ambulatory Visit | Attending: Obstetrics and Gynecology | Admitting: Obstetrics and Gynecology

## 2017-09-06 LAB — CBC
HCT: 32.8 % — ABNORMAL LOW (ref 36.0–46.0)
Hemoglobin: 11 g/dL — ABNORMAL LOW (ref 12.0–15.0)
MCH: 30.1 pg (ref 26.0–34.0)
MCHC: 33.5 g/dL (ref 30.0–36.0)
MCV: 89.6 fL (ref 78.0–100.0)
PLATELETS: 300 10*3/uL (ref 150–400)
RBC: 3.66 MIL/uL — ABNORMAL LOW (ref 3.87–5.11)
RDW: 14.2 % (ref 11.5–15.5)
WBC: 9.6 10*3/uL (ref 4.0–10.5)

## 2017-09-06 NOTE — H&P (Signed)
Kathryn Moyer is a 32 y.o. female G2P1001 at 3339 1/7 weeks (EDD 09/13/2017 by 8 week US inconsistent with LMP)  presenting for scheduled repeat c-section.  Prenatal care complicated by prior LTCS for FHR decelerations, she declines a TOL.  Prenatal care otherwise uneventful.  Her first son has autism.   OB History    Gravida Para Term Preterm AB Living   2 1 1     1    SAB TAB Ectopic Multiple Live Births           1    632015 LTCS female 7#3oz  Past Medical History:  Diagnosis Date  . Hx of varicella   . Medical history non-contributory    Past Surgical History:  Procedure Laterality Date  . ANTERIOR CRUCIATE LIGAMENT REPAIR     R klnee  . CESAREAN SECTION N/A 04/02/2014   Procedure: CESAREAN SECTION;  Surgeon: Oliver PilaKathy W Codi Folkerts, MD;  Location: [facility];  Service: Obstetrics;  Laterality: N/A;   Family History: family history includes Autism in her son; Cancer in her maternal grandmother; Diabetes in her maternal grandfather and paternal grandfather; Hearing loss in her paternal grandfather and paternal grandmother; Hypertension in her father and mother. Social History:  reports that she quit smoking about 4 years ago. Her smoking use included cigarettes. She smoked 0.00 packs per day for 4.00 years. she has never used smokeless tobacco. She reports that she does not drink alcohol or use drugs.     Maternal Diabetes: No Genetic Screening: SMA carrier, but FOB negative  All other screening negative Maternal Ultrasounds/Referrals: Normal Fetal Ultrasounds or other Referrals:  None Maternal Substance Abuse:  No Significant Maternal Medications:  None Significant Maternal Lab Results:  None Other Comments:  None  Review of Systems  Gastrointestinal: Negative for abdominal pain.  Neurological: Negative for headaches.   Maternal Medical History:  Contractions: Frequency: rare.   Perceived severity is mild.    Fetal activity: Perceived fetal activity is normal.    Prenatal Complications  - Diabetes: none.      Last menstrual period 11/29/2016, unknown if currently breastfeeding. Maternal Exam:  Uterine Assessment: Contraction strength is mild.  Contraction frequency is rare.   Abdomen: Patient reports no abdominal tenderness. Fetal presentation: vertex  Introitus: Normal vulva. Normal vagina.    Physical Exam  Constitutional: She is oriented to person, place, and time. She appears well-developed.  HENT:  Head: Normocephalic.  Cardiovascular: Normal rate and regular rhythm.  Respiratory: Effort normal and breath sounds normal.  GI: Soft.  Genitourinary: Vagina normal.  Neurological: She is alert and oriented to person, place, and time.  Psychiatric: She has a normal mood and affect.    Prenatal labs: ABO, Rh: O/Positive/-- (07/02 0000) Antibody: Negative (07/02 0000) Rubella: Immune (07/02 0000) RPR: Nonreactive (07/02 0000)  HBsAg: Negative (07/02 0000)  HIV: Non-reactive (07/02 0000)  GBS: Negative (01/02 0000)  First trimester screen WNL One hour GCT 106 Hgb AA SMA carrier, FOB negative CF and fragile X negative   Assessment/Plan: d/w pt procedure in detail including risks and benefits of bleeding, infection and possible damage to bowel and bladder.  Pt would accept blood if needed.      Pt ready to proceed.   Oliver PilaKathy W Syrianna Schillaci 09/06/2017, 2:49 PM

## 2017-09-07 ENCOUNTER — Inpatient Hospital Stay (HOSPITAL_COMMUNITY): Payer: Medicaid Other | Admitting: Anesthesiology

## 2017-09-07 ENCOUNTER — Encounter (HOSPITAL_COMMUNITY): Payer: Self-pay

## 2017-09-07 ENCOUNTER — Other Ambulatory Visit: Payer: Self-pay

## 2017-09-07 ENCOUNTER — Inpatient Hospital Stay (HOSPITAL_COMMUNITY)
Admission: RE | Admit: 2017-09-07 | Discharge: 2017-09-10 | DRG: 788 | Disposition: A | Discharge status: Home / Self Care | Payer: Medicaid Other | Source: Ambulatory Visit | Attending: Obstetrics and Gynecology | Admitting: Obstetrics and Gynecology

## 2017-09-07 ENCOUNTER — Encounter (HOSPITAL_COMMUNITY)
Admission: RE | Disposition: A | Discharge status: Home / Self Care | Payer: Self-pay | Source: Ambulatory Visit | Attending: Obstetrics and Gynecology

## 2017-09-07 DIAGNOSIS — D649 Anemia, unspecified: Secondary | ICD-10-CM | POA: Diagnosis present

## 2017-09-07 DIAGNOSIS — O9902 Anemia complicating childbirth: Secondary | ICD-10-CM | POA: Diagnosis present

## 2017-09-07 DIAGNOSIS — Z98891 History of uterine scar from previous surgery: Secondary | ICD-10-CM

## 2017-09-07 DIAGNOSIS — Z87891 Personal history of nicotine dependence: Secondary | ICD-10-CM

## 2017-09-07 DIAGNOSIS — O34211 Maternal care for low transverse scar from previous cesarean delivery: Principal | ICD-10-CM | POA: Diagnosis present

## 2017-09-07 DIAGNOSIS — Z3A39 39 weeks gestation of pregnancy: Secondary | ICD-10-CM | POA: Diagnosis not present

## 2017-09-07 LAB — TYPE AND SCREEN
ABO/RH(D): O POS
ANTIBODY SCREEN: NEGATIVE

## 2017-09-07 LAB — RPR: RPR Ser Ql: NONREACTIVE

## 2017-09-07 LAB — ABO/RH: ABO/RH(D): O POS

## 2017-09-07 SURGERY — Surgical Case
Anesthesia: Spinal

## 2017-09-07 MED ORDER — COCONUT OIL OIL
1.0000 "application " | TOPICAL_OIL | Status: DC | PRN
  Administered 2017-09-08: 1 via TOPICAL
  Filled 2017-09-07: qty 120

## 2017-09-07 MED ORDER — OXYCODONE HCL 5 MG PO TABS: 10.0000 mg | ORAL_TABLET | ORAL | Status: DC | PRN

## 2017-09-07 MED ORDER — IBUPROFEN 600 MG PO TABS
600.0000 mg | ORAL_TABLET | Freq: Four times a day (QID) | ORAL | Status: DC
  Administered 2017-09-07 – 2017-09-10 (×12): 600 mg via ORAL
  Filled 2017-09-07 (×12): qty 1

## 2017-09-07 MED ORDER — SCOPOLAMINE 1 MG/3DAYS TD PT72: 1.0000 | MEDICATED_PATCH | Freq: Once | TRANSDERMAL | Status: DC

## 2017-09-07 MED ORDER — NALBUPHINE HCL 10 MG/ML IJ SOLN: 5.0000 mg | INTRAMUSCULAR | Status: DC | PRN

## 2017-09-07 MED ORDER — DIPHENHYDRAMINE HCL 50 MG/ML IJ SOLN
INTRAMUSCULAR | Status: DC | PRN
  Administered 2017-09-07: 25 mg via INTRAVENOUS

## 2017-09-07 MED ORDER — SODIUM CHLORIDE 0.9% FLUSH
3.0000 mL | INTRAVENOUS | Status: DC | PRN
Start: 2017-09-07 — End: 2017-09-10

## 2017-09-07 MED ORDER — FENTANYL CITRATE (PF) 100 MCG/2ML IJ SOLN
INTRAMUSCULAR | Status: AC
  Filled 2017-09-07: qty 2

## 2017-09-07 MED ORDER — ONDANSETRON HCL 4 MG/2ML IJ SOLN
INTRAMUSCULAR | Status: DC | PRN
  Administered 2017-09-07: 4 mg via INTRAVENOUS

## 2017-09-07 MED ORDER — SIMETHICONE 80 MG PO CHEW
80.0000 mg | CHEWABLE_TABLET | Freq: Three times a day (TID) | ORAL | Status: DC
  Administered 2017-09-07 – 2017-09-10 (×7): 80 mg via ORAL
  Filled 2017-09-07 (×8): qty 1

## 2017-09-07 MED ORDER — LACTATED RINGERS IV SOLN
INTRAVENOUS | Status: DC
  Administered 2017-09-07 (×2): via INTRAVENOUS

## 2017-09-07 MED ORDER — MORPHINE SULFATE (PF) 0.5 MG/ML IJ SOLN
INTRAMUSCULAR | Status: AC
  Filled 2017-09-07: qty 10

## 2017-09-07 MED ORDER — CEFAZOLIN SODIUM-DEXTROSE 2-4 GM/100ML-% IV SOLN
2.0000 g | INTRAVENOUS | Status: AC
  Administered 2017-09-07: 2 g via INTRAVENOUS
  Filled 2017-09-07: qty 100

## 2017-09-07 MED ORDER — ONDANSETRON HCL 4 MG/2ML IJ SOLN
INTRAMUSCULAR | Status: AC
  Filled 2017-09-07: qty 2

## 2017-09-07 MED ORDER — PRENATAL MULTIVITAMIN CH
1.0000 | ORAL_TABLET | Freq: Every day | ORAL | Status: DC
  Administered 2017-09-08 – 2017-09-10 (×3): 1 via ORAL
  Filled 2017-09-07 (×3): qty 1

## 2017-09-07 MED ORDER — DIBUCAINE 1 % RE OINT: 1.0000 "application " | TOPICAL_OINTMENT | RECTAL | Status: DC | PRN

## 2017-09-07 MED ORDER — OXYTOCIN 10 UNIT/ML IJ SOLN
INTRAMUSCULAR | Status: DC | PRN
  Administered 2017-09-07: 40 [IU] via INTRAVENOUS

## 2017-09-07 MED ORDER — MENTHOL 3 MG MT LOZG: 1.0000 | LOZENGE | OROMUCOSAL | Status: DC | PRN

## 2017-09-07 MED ORDER — OXYTOCIN 10 UNIT/ML IJ SOLN
INTRAMUSCULAR | Status: AC
  Filled 2017-09-07: qty 4

## 2017-09-07 MED ORDER — FENTANYL CITRATE (PF) 100 MCG/2ML IJ SOLN
25.0000 ug | INTRAMUSCULAR | Status: DC | PRN
  Administered 2017-09-07: 25 ug via INTRAVENOUS

## 2017-09-07 MED ORDER — MORPHINE SULFATE (PF) 0.5 MG/ML IJ SOLN
INTRAMUSCULAR | Status: DC | PRN
  Administered 2017-09-07: .2 mg via INTRATHECAL

## 2017-09-07 MED ORDER — DEXAMETHASONE SODIUM PHOSPHATE 10 MG/ML IJ SOLN
INTRAMUSCULAR | Status: AC
  Filled 2017-09-07: qty 1

## 2017-09-07 MED ORDER — NALOXONE HCL 4 MG/10ML IJ SOLN
1.0000 ug/kg/h | INTRAVENOUS | Status: DC | PRN
  Filled 2017-09-07: qty 5

## 2017-09-07 MED ORDER — OXYTOCIN 40 UNITS IN LACTATED RINGERS INFUSION - SIMPLE MED: 2.5000 [IU]/h | INTRAVENOUS | Status: AC

## 2017-09-07 MED ORDER — OXYCODONE HCL 5 MG PO TABS
5.0000 mg | ORAL_TABLET | ORAL | Status: DC | PRN
  Administered 2017-09-08 – 2017-09-10 (×3): 5 mg via ORAL
  Filled 2017-09-07 (×3): qty 1

## 2017-09-07 MED ORDER — MEPERIDINE HCL 25 MG/ML IJ SOLN: 6.2500 mg | INTRAMUSCULAR | Status: DC | PRN

## 2017-09-07 MED ORDER — SOD CITRATE-CITRIC ACID 500-334 MG/5ML PO SOLN: 30.0000 mL | Freq: Once | ORAL | Status: DC

## 2017-09-07 MED ORDER — DIPHENHYDRAMINE HCL 25 MG PO CAPS: 25.0000 mg | ORAL_CAPSULE | Freq: Four times a day (QID) | ORAL | Status: DC | PRN

## 2017-09-07 MED ORDER — SODIUM CHLORIDE 0.9 % IR SOLN
Status: DC | PRN
  Administered 2017-09-07: 1000 mL

## 2017-09-07 MED ORDER — FAMOTIDINE 20 MG PO TABS: 20.0000 mg | ORAL_TABLET | Freq: Once | ORAL | Status: DC

## 2017-09-07 MED ORDER — NALOXONE HCL 0.4 MG/ML IJ SOLN: 0.4000 mg | INTRAMUSCULAR | Status: DC | PRN

## 2017-09-07 MED ORDER — SCOPOLAMINE 1 MG/3DAYS TD PT72
MEDICATED_PATCH | TRANSDERMAL | Status: AC
Start: 2017-09-07 — End: 2017-09-07
  Filled 2017-09-07: qty 1

## 2017-09-07 MED ORDER — SIMETHICONE 80 MG PO CHEW: 80.0000 mg | CHEWABLE_TABLET | ORAL | Status: DC | PRN

## 2017-09-07 MED ORDER — KETOROLAC TROMETHAMINE 30 MG/ML IJ SOLN: 30.0000 mg | Freq: Four times a day (QID) | INTRAMUSCULAR | Status: AC | PRN

## 2017-09-07 MED ORDER — PHENYLEPHRINE 8 MG IN D5W 100 ML (0.08MG/ML) PREMIX OPTIME
INJECTION | INTRAVENOUS | Status: AC
  Filled 2017-09-07: qty 100

## 2017-09-07 MED ORDER — DIPHENHYDRAMINE HCL 50 MG/ML IJ SOLN
12.5000 mg | INTRAMUSCULAR | Status: DC | PRN
Start: 2017-09-07 — End: 2017-09-10

## 2017-09-07 MED ORDER — FENTANYL CITRATE (PF) 100 MCG/2ML IJ SOLN
INTRAMUSCULAR | Status: DC | PRN
  Administered 2017-09-07: 10 ug via INTRATHECAL

## 2017-09-07 MED ORDER — ZOLPIDEM TARTRATE 5 MG PO TABS: 5.0000 mg | ORAL_TABLET | Freq: Every evening | ORAL | Status: DC | PRN

## 2017-09-07 MED ORDER — PHENYLEPHRINE HCL 10 MG/ML IJ SOLN
INTRAMUSCULAR | Status: DC | PRN
  Administered 2017-09-07 (×2): 80 ug via INTRAVENOUS

## 2017-09-07 MED ORDER — DIPHENHYDRAMINE HCL 25 MG PO CAPS: 25.0000 mg | ORAL_CAPSULE | ORAL | Status: DC | PRN

## 2017-09-07 MED ORDER — ACETAMINOPHEN 325 MG PO TABS
650.0000 mg | ORAL_TABLET | ORAL | Status: DC | PRN
  Administered 2017-09-08: 650 mg via ORAL
  Filled 2017-09-07: qty 2

## 2017-09-07 MED ORDER — DEXAMETHASONE SODIUM PHOSPHATE 10 MG/ML IJ SOLN
INTRAMUSCULAR | Status: DC | PRN
  Administered 2017-09-07: 10 mg via INTRAVENOUS

## 2017-09-07 MED ORDER — PHENYLEPHRINE 8 MG IN D5W 100 ML (0.08MG/ML) PREMIX OPTIME
INJECTION | INTRAVENOUS | Status: DC | PRN
  Administered 2017-09-07: 60 ug/min via INTRAVENOUS

## 2017-09-07 MED ORDER — LACTATED RINGERS IV SOLN
INTRAVENOUS | Status: DC
  Administered 2017-09-07: 17:00:00 via INTRAVENOUS

## 2017-09-07 MED ORDER — WITCH HAZEL-GLYCERIN EX PADS: 1.0000 "application " | MEDICATED_PAD | CUTANEOUS | Status: DC | PRN

## 2017-09-07 MED ORDER — PROMETHAZINE HCL 25 MG/ML IJ SOLN: 6.2500 mg | INTRAMUSCULAR | Status: DC | PRN

## 2017-09-07 MED ORDER — SENNOSIDES-DOCUSATE SODIUM 8.6-50 MG PO TABS
2.0000 | ORAL_TABLET | ORAL | Status: DC
  Administered 2017-09-08 – 2017-09-10 (×3): 2 via ORAL
  Filled 2017-09-07 (×3): qty 2

## 2017-09-07 MED ORDER — TETANUS-DIPHTH-ACELL PERTUSSIS 5-2.5-18.5 LF-MCG/0.5 IM SUSP: 0.5000 mL | Freq: Once | INTRAMUSCULAR | Status: DC

## 2017-09-07 MED ORDER — BUPIVACAINE IN DEXTROSE 0.75-8.25 % IT SOLN
INTRATHECAL | Status: DC | PRN
  Administered 2017-09-07: 1.6 mL via INTRATHECAL

## 2017-09-07 MED ORDER — NALBUPHINE HCL 10 MG/ML IJ SOLN: 5.0000 mg | Freq: Once | INTRAMUSCULAR | Status: DC | PRN

## 2017-09-07 MED ORDER — ACETAMINOPHEN 500 MG PO TABS
1000.0000 mg | ORAL_TABLET | Freq: Four times a day (QID) | ORAL | Status: AC
  Administered 2017-09-07 – 2017-09-08 (×3): 1000 mg via ORAL
  Filled 2017-09-07 (×3): qty 2

## 2017-09-07 MED ORDER — DIPHENHYDRAMINE HCL 50 MG/ML IJ SOLN
INTRAMUSCULAR | Status: AC
  Filled 2017-09-07: qty 1

## 2017-09-07 MED ORDER — ONDANSETRON HCL 4 MG/2ML IJ SOLN: 4.0000 mg | Freq: Three times a day (TID) | INTRAMUSCULAR | Status: DC | PRN

## 2017-09-07 MED ORDER — SCOPOLAMINE 1 MG/3DAYS TD PT72
MEDICATED_PATCH | TRANSDERMAL | Status: DC | PRN
  Administered 2017-09-07: 1 via TRANSDERMAL

## 2017-09-07 MED ORDER — SIMETHICONE 80 MG PO CHEW
80.0000 mg | CHEWABLE_TABLET | ORAL | Status: DC
  Administered 2017-09-08 – 2017-09-10 (×3): 80 mg via ORAL
  Filled 2017-09-07 (×3): qty 1

## 2017-09-07 SURGICAL SUPPLY — 36 items
APL SKNCLS STERI-STRIP NONHPOA (GAUZE/BANDAGES/DRESSINGS) ×1
BENZOIN TINCTURE PRP APPL 2/3 (GAUZE/BANDAGES/DRESSINGS) ×2 IMPLANT
CHLORAPREP W/TINT 26ML (MISCELLANEOUS) ×3 IMPLANT
CLAMP CORD UMBIL (MISCELLANEOUS) IMPLANT
CLOSURE WOUND 1/2 X4 (GAUZE/BANDAGES/DRESSINGS) ×1
CLOTH BEACON ORANGE TIMEOUT ST (SAFETY) ×3 IMPLANT
DRSG OPSITE POSTOP 4X10 (GAUZE/BANDAGES/DRESSINGS) ×3 IMPLANT
ELECT REM PT RETURN 9FT ADLT (ELECTROSURGICAL) ×3
ELECTRODE REM PT RTRN 9FT ADLT (ELECTROSURGICAL) ×1 IMPLANT
EXTRACTOR VACUUM KIWI (MISCELLANEOUS) IMPLANT
GLOVE BIO SURGEON STRL SZ 6.5 (GLOVE) ×2 IMPLANT
GLOVE BIO SURGEONS STRL SZ 6.5 (GLOVE) ×1
GLOVE BIOGEL PI IND STRL 7.0 (GLOVE) ×1 IMPLANT
GLOVE BIOGEL PI INDICATOR 7.0 (GLOVE) ×2
GOWN STRL REUS W/TWL LRG LVL3 (GOWN DISPOSABLE) ×6 IMPLANT
KIT ABG SYR 3ML LUER SLIP (SYRINGE) IMPLANT
NDL HYPO 25X5/8 SAFETYGLIDE (NEEDLE) IMPLANT
NEEDLE HYPO 25X5/8 SAFETYGLIDE (NEEDLE) IMPLANT
NS IRRIG 1000ML POUR BTL (IV SOLUTION) ×3 IMPLANT
PACK C SECTION WH (CUSTOM PROCEDURE TRAY) ×3 IMPLANT
PAD OB MATERNITY 4.3X12.25 (PERSONAL CARE ITEMS) ×3 IMPLANT
PENCIL SMOKE EVAC W/HOLSTER (ELECTROSURGICAL) ×3 IMPLANT
RTRCTR C-SECT PINK 25CM LRG (MISCELLANEOUS) ×3 IMPLANT
STRIP CLOSURE SKIN 1/2X4 (GAUZE/BANDAGES/DRESSINGS) ×1 IMPLANT
SUT CHROMIC 1 CTX 36 (SUTURE) ×6 IMPLANT
SUT PLAIN 0 NONE (SUTURE) IMPLANT
SUT PLAIN 2 0 XLH (SUTURE) ×3 IMPLANT
SUT VIC AB 0 CT1 27 (SUTURE) ×6
SUT VIC AB 0 CT1 27XBRD ANBCTR (SUTURE) ×2 IMPLANT
SUT VIC AB 2-0 CT1 27 (SUTURE) ×3
SUT VIC AB 2-0 CT1 TAPERPNT 27 (SUTURE) ×1 IMPLANT
SUT VIC AB 3-0 CT1 27 (SUTURE)
SUT VIC AB 3-0 CT1 TAPERPNT 27 (SUTURE) IMPLANT
SUT VIC AB 4-0 KS 27 (SUTURE) ×3 IMPLANT
TOWEL OR 17X24 6PK STRL BLUE (TOWEL DISPOSABLE) ×3 IMPLANT
TRAY FOLEY BAG SILVER LF 14FR (SET/KITS/TRAYS/PACK) ×3 IMPLANT

## 2017-09-07 NOTE — Anesthesia Postprocedure Evaluation (Signed)
Anesthesia Post Note  Patient: Kathryn Moyer  Procedure(s) Performed: REPEAT CESAREAN SECTION (N/A )     Patient location during evaluation: Mother Baby Anesthesia Type: Spinal Level of consciousness: awake and alert and oriented Pain management: satisfactory to patient Vital Signs Assessment: post-procedure vital signs reviewed and stable Respiratory status: spontaneous breathing and nonlabored ventilation Cardiovascular status: stable Postop Assessment: no headache, no backache, patient able to bend at knees, no signs of nausea or vomiting and adequate PO intake Anesthetic complications: no    Last Vitals:  Vitals:   09/07/17 1357 09/07/17 1700  BP: 110/67   Pulse: 74   Resp: 16   Temp: 36.9 C 36.9 C  SpO2: 94%     Last Pain:  Vitals:   09/07/17 1710  TempSrc:   PainSc: 2    Pain Goal:                 [name]

## 2017-09-07 NOTE — Op Note (Signed)
Operative Note    Preoperative Diagnosis Term pregnancy at 39 1/7 weeks Prior c-section, declines TOL  Postoperative Diagnosis same  Procedure Repeat Low Transverse C-section  Surgeon Huel CoteKathy Rice Walsh, MD Genice Rougeracey Tucker, RNFA  Anesthesia Spinal  Fluids: EBL 517mL UOP 100mL clear IVF 2200mL  Findings Viable female infant in the vertex presentation.  Apgars 8,9.  Weight pending  Normal uterus, tubes, and ovaries  Specimen Placenta to L&D  Procedure Note Patient was taken to the operating room where spinal anesthesia was obtained and found to be adequate by Allis clamp test. She was prepped and draped in the normal sterile fashion in the dorsal supine position with a leftward tilt. An appropriate time out was performed. A Pfannenstiel skin incision was then made through a pre-existing scar with the scalpel and carried through to the underlying layer of fascia by sharp dissection and Bovie cautery. The fascia was nicked in the midline and the incision was extended laterally with Mayo scissors. The inferior aspect of the incision was grasped Coker clamps and dissected off the underlying rectus muscles. In a similar fashion the superior aspect was dissected off the rectus muscles. Rectus muscles were separated in the midline and the peritoneal cavity entered bluntly. The peritoneal incision was then extended both superiorly and inferiorly with careful attention to avoid both bowel and bladder. The Alexis self-retaining wound retractor was then placed within the incision and the lower uterine segment exposed. The bladder flap was developed with Metzenbaum scissors and pushed away from the lower uterine segment. The lower uterine segment was then incised in a transverse fashion and the cavity itself entered bluntly. The incision was extended bluntly. The infant's head was then lifted and delivered from the incision without difficulty. The remainder of the infant delivered and the nose and mouth  bulb suctioned with the cord clamped and cut as well. The infant was handed off to the waiting pediatricians. The placenta was then spontaneously expressed from the uterus and the uterus cleared of all clots and debris with moist lap sponge. The uterine incision was then repaired in 2 layers the first layer was a running locked layer of 1-0 chromic and the second an imbricating layer of the same suture. The tubes and ovaries were inspected and the gutters cleared of all clots and debris. The uterine incision was inspected and found to be hemostatic. All instruments and sponges as well as the Alexis retractor were then removed from the abdomen. The rectus muscles and peritoneum were then reapproximated with a running suture of 2-0 Vicryl. The fascia was then closed with 0 Vicryl in a running fashion. Subcutaneous tissue was reapproximated with 3-0 plain in a running fashion. The skin was closed with a subcuticular stitch of 4-0 Vicryl on a Keith needle and then reinforced with benzoin and Steri-Strips. At the conclusion of the procedure all instruments and sponge counts were correct. Patient was taken to the recovery room in good condition with her baby accompanying her skin to skin.   D/w pt circumcision and she plans to do in the office

## 2017-09-07 NOTE — Anesthesia Preprocedure Evaluation (Addendum)
Anesthesia Evaluation  Patient identified by MRN, date of birth, ID band Patient awake    Reviewed: Allergy & Precautions, NPO status , Patient's Chart, lab work & pertinent test results  Airway Mallampati: II  TM Distance: >3 FB Neck ROM: Full    Dental  (+) Teeth Intact, Dental Advisory Given   Pulmonary former smoker,    Pulmonary exam normal breath sounds clear to auscultation       Cardiovascular negative cardio ROS Normal cardiovascular exam Rhythm:Regular Rate:Normal     Neuro/Psych negative neurological ROS  negative psych ROS   GI/Hepatic negative GI ROS, Neg liver ROS,   Endo/Other  negative endocrine ROSObesity   Renal/GU negative Renal ROS     Musculoskeletal negative musculoskeletal ROS (+)   Abdominal   Peds  Hematology  (+) Blood dyscrasia, anemia , Plt 300k   Anesthesia Other Findings Day of surgery medications reviewed with the patient.  Reproductive/Obstetrics (+) Pregnancy H/o C-section x1                             Anesthesia Physical Anesthesia Plan  ASA: II  Anesthesia Plan: Spinal   Post-op Pain Management:    Induction:   PONV Risk Score and Plan: 2 and Treatment may vary due to age or medical condition, Dexamethasone, Ondansetron and Scopolamine patch - Pre-op  Airway Management Planned:   Additional Equipment:   Intra-op Plan:   Post-operative Plan:   Informed Consent: I have reviewed the patients History and Physical, chart, labs and discussed the procedure including the risks, benefits and alternatives for the proposed anesthesia with the patient or authorized representative who has indicated his/her understanding and acceptance.   Dental advisory given  Plan Discussed with: CRNA, Anesthesiologist and Surgeon  Anesthesia Plan Comments: (Discussed risks and benefits of and differences between spinal and general. Discussed risks of spinal  including headache, backache, failure, bleeding, infection, and nerve damage. Patient consents to spinal. Questions answered. Coagulation studies and platelet count acceptable.)        Anesthesia Quick Evaluation

## 2017-09-07 NOTE — Progress Notes (Signed)
Patient ID: Kathryn Moyer, female   DOB: Jun 13, 1986, 32 y.o.   MRN: [id number] Per pt no changes in dictated H&P. Ready to proceed.  Brief exam WNL.

## 2017-09-07 NOTE — Anesthesia Postprocedure Evaluation (Signed)
Anesthesia Post Note  Patient: Kathryn Moyer  Procedure(s) Performed: REPEAT CESAREAN SECTION (N/A )     Patient location during evaluation: PACU Anesthesia Type: Spinal Level of consciousness: oriented and awake and alert Pain management: pain level controlled Vital Signs Assessment: post-procedure vital signs reviewed and stable Respiratory status: spontaneous breathing, respiratory function stable and patient connected to nasal cannula oxygen Cardiovascular status: blood pressure returned to baseline and stable Postop Assessment: no headache, no backache, no apparent nausea or vomiting, spinal receding and patient able to bend at knees Anesthetic complications: no    Last Vitals:  Vitals:   09/07/17 1045 09/07/17 1101  BP: 111/75 113/66  Pulse: 84 81  Resp: 16 16  Temp:  36.7 C  SpO2: 98%     Last Pain:  Vitals:   09/07/17 1101  TempSrc: Oral  PainSc:    Pain Goal:                 Cecile HearingStephen Edward Jonmarc Bodkin

## 2017-09-07 NOTE — Anesthesia Procedure Notes (Signed)
Spinal  Patient location during procedure: OR Start time: 09/07/2017 7:57 AM End time: 09/07/2017 7:59 AM Staffing Anesthesiologist: Cecile Hearingurk, Stephen Edward, MD Performed: anesthesiologist  Preanesthetic Checklist Completed: patient identified, surgical consent, pre-op evaluation, timeout performed, IV checked, risks and benefits discussed and monitors and equipment checked Spinal Block Patient position: sitting Prep: site prepped and draped and DuraPrep Patient monitoring: continuous pulse ox and blood pressure Approach: midline Location: L3-4 Injection technique: single-shot Needle Needle type: Pencan  Needle gauge: 24 G Assessment Sensory level: T4 Additional Notes Functioning IV was confirmed and monitors were applied. Sterile prep and drape, including hand hygiene, mask and sterile gloves were used. The patient was positioned and the spine was prepped. The skin was anesthetized with lidocaine.  Free flow of clear CSF was obtained prior to injecting local anesthetic into the CSF.  The spinal needle aspirated freely following injection.  The needle was carefully withdrawn.  The patient tolerated the procedure well. Consent was obtained prior to procedure with all questions answered and concerns addressed. Risks including but not limited to bleeding, infection, nerve damage, paralysis, failed block, inadequate analgesia, allergic reaction, high spinal, itching and headache were discussed and the patient wished to proceed.   Arrie AranStephen Turk, MD

## 2017-09-07 NOTE — Transfer of Care (Signed)
Immediate Anesthesia Transfer of Care Note  Patient: Kathryn Moyer  Procedure(s) Performed: REPEAT CESAREAN SECTION (N/A )  Patient Location: PACU  Anesthesia Type:Spinal  Level of Consciousness: awake, alert  and oriented  Airway & Oxygen Therapy: Patient Spontanous Breathing  Post-op Assessment: Report given to RN and Post -op Vital signs reviewed and stable  Post vital signs: Reviewed and stable  Last Vitals:  Vitals:   09/07/17 0645  BP: 115/77  Pulse: 99  Resp: 17  Temp: 36.6 C  SpO2: 97%    Last Pain:  Vitals:   09/07/17 0645  TempSrc: Oral         Complications: No apparent anesthesia complications

## 2017-09-07 NOTE — Addendum Note (Signed)
Addendum  created 09/07/17 1817 by Shanon PayorGregory, Fawnda Vitullo M, CRNA   Sign clinical note

## 2017-09-08 LAB — CBC
HCT: 28.7 % — ABNORMAL LOW (ref 36.0–46.0)
HEMOGLOBIN: 9.8 g/dL — AB (ref 12.0–15.0)
MCH: 30.5 pg (ref 26.0–34.0)
MCHC: 34.1 g/dL (ref 30.0–36.0)
MCV: 89.4 fL (ref 78.0–100.0)
Platelets: 266 10*3/uL (ref 150–400)
RBC: 3.21 MIL/uL — AB (ref 3.87–5.11)
RDW: 14.2 % (ref 11.5–15.5)
WBC: 16.9 10*3/uL — AB (ref 4.0–10.5)

## 2017-09-08 LAB — BIRTH TISSUE RECOVERY COLLECTION (PLACENTA DONATION)

## 2017-09-08 NOTE — Progress Notes (Addendum)
0030: Pt reported eating and drinking well, denies pain  or discomfort. LR d/c' V NSL.   0330: Patient ambulating well, no assistance needed, foley cath d/c'd as ordered. Peri care performed, all parts accounted for. Pt tolerated procedure well. We will continue to monitor.

## 2017-09-08 NOTE — Lactation Note (Signed)
This note was copied from a baby's chart. Lactation Consultation Note  Patient Name: Kathryn Moyer [id number] Date: 09/08/2017 Reason for consult: Initial assessment;Hyperbilirubinemia;1st time breastfeeding;Term   Initial consult with mom of 59 hour old infant. Infant with 7 BF for 10-40 minutes, 5 voids and 3 stools in the last 24 hours. Infant weight 7 lb 15.3 oz with weight loss of 4% since birth.   Mom had infant latched to the right breast in the cradle hold. Infant with deep latch and feeding actively. Infant would take a few breaks. Showed mom awakening techniques and enc mom to massage/compress breast with feedings.   Mom is aware infant with jaundice related to ABO incompatibility. Enc mom to hand express and spoon feed infant after BF. Mom has pump kit in the room for DEBP. Reviewed with mom that if infant jaundice rises more or if he goes under photo therapy that mom is to start pumping Post BF and supplementing infant with her EBM. Mom will need to be shown how to set up and use her DEBP again.   Mom asked about renting a pump for 2 months. Mom is active with WIC. Reviewed pump loaner and getting a pump from [facility].  Mom asked if she could give infant a pacifier. Enc mom to wait for a few weeks and to meet suckling needs at the breast.   Reviewed BF basics, colostrum and milk coming to volume and normalcy of cluster feeding. Enc mom to feed infant STS 8-12 x in 24 hours at first feeding cues. Enc mom to massage/compress breast with feedings. Mom reports her breasts are feeling fuller today. Mom denies nipple pain/tenderness with feedings.   BF Resources handout and [place] Brochure given, mom informed of IP/OP Services, BF Support Groups and [place] phone #. Enc mom to call out for feeding assistance as needed.   Coconut oil given with instructions for use at mom's request. Mom reports all questions/concerns have been answered. Reprot and plan of care to Gaylyn Rong, RN.     Maternal  Data Formula Feeding for Exclusion: No Has patient been taught Hand Expression?: Yes Does the patient have breastfeeding experience prior to this delivery?: Yes  Feeding Feeding Type: Breast Fed Length of feed: 15 min(still BF when LC left room)  LATCH Score Latch: Grasps breast easily, tongue down, lips flanged, rhythmical sucking.  Audible Swallowing: A few with stimulation  Type of Nipple: Everted at rest and after stimulation  Comfort (Breast/Nipple): Soft / non-tender  Hold (Positioning): No assistance needed to correctly position infant at breast.  LATCH Score: 9  Interventions Interventions: Breast feeding basics reviewed;Skin to skin;Assisted with latch;Support pillows;Breast massage;Breast compression;Hand express;Coconut oil;Expressed milk  Lactation Tools Discussed/Used WIC Program: Yes   Consult Status Consult Status: Follow-up Date: 09/09/17 Follow-up type: In-patient    Debby Freiberg Carigan Lister 09/08/2017, 9:48 PM

## 2017-09-08 NOTE — Progress Notes (Signed)
Subjective: Postpartum Day #1: Cesarean Delivery Patient reports incisional pain, tolerating PO and no problems voiding.    Objective: Vital signs in last 24 hours: Temp:  [98.1 F (36.7 C)-98.5 F (36.9 C)] 98.3 F (36.8 C) (02/02 0529) Pulse Rate:  [70-91] 70 (02/01 2100) Resp:  [14-25] 16 (02/02 0529) BP: (101-122)/(59-75) 113/59 (02/02 0529) SpO2:  [94 %-99 %] 94 % (02/01 1357)  Physical Exam:  General: alert Lochia: appropriate Uterine Fundus: firm Incision: dressing C/D/I  Recent Labs    09/06/17 1115 09/08/17 0403  HGB 11.0* 9.8*  HCT 32.8* 28.7*    Assessment/Plan: Status post Cesarean section. Doing well postoperatively.  Continue current care, ambulate.  Leighton Roachodd D Allin Frix 09/08/2017, 9:16 AM

## 2017-09-09 NOTE — Lactation Note (Signed)
This note was copied from a baby's chart. Lactation Consultation Note  Patient Name: Kathryn MoyerUToday's Date: 09/09/2017 Reason for consult: Follow-up assessment;Infant weight loss  Baby is 6956 hours old  [facility] , and she was providing ice packs.  Per mom iced for 15 -20 mins, and pumped off >30 ml and fed the baby on the left breast for  15 mins and baby softened well. Baby still hungry and LC assisted to latch on the right breast  Laid back and depth noted. Multiple swallows noted, increased with breast compressions. Baby released  And nipple well rounded.  LC recommended to mom after she finished eating to plan on post pump for 10 mins, and save the milk in the  Refrig. LC recommended for mom to just breast feed ,  LC assisted mom to settled baby back in the bed with double photo and sun glasses on .  [facility]    Maternal Data    Feeding Feeding Type: (baby latched with depth , swallows noted ) Length of feed: (multiple swallows noted )  LATCH Score                   Interventions Interventions: Breast feeding basics reviewed  Lactation Tools Discussed/Used     Consult Status Consult Status: Follow-up Date: 09/10/17 Follow-up type: In-patient    Kathryn Moyer 09/09/2017, 4:44 PM

## 2017-09-09 NOTE — Progress Notes (Signed)
POD #2 Doing well, some pain Afeb, VSS Abd- soft, fundus firm, incision intact Continue routine care

## 2017-09-10 MED ORDER — IBUPROFEN 600 MG PO TABS: 600.0000 mg | ORAL_TABLET | Freq: Four times a day (QID) | ORAL | 1 refills | Status: DC | PRN

## 2017-09-10 MED ORDER — OXYCODONE HCL 5 MG PO TABS: 5.0000 mg | ORAL_TABLET | Freq: Four times a day (QID) | ORAL | 0 refills | Status: DC | PRN

## 2017-09-10 MED ORDER — MULTI-VITAMIN/MINERALS PO TABS: 1.0000 | ORAL_TABLET | Freq: Every day | ORAL | 3 refills | Status: DC

## 2017-09-10 NOTE — Progress Notes (Signed)
Post Partum Day 4 Subjective: no complaints, up ad lib, voiding, tolerating PO and nl lochia, pain controlled  Objective: Blood pressure 131/70, pulse 92, temperature 98 F (36.7 C), temperature source Oral, resp. rate 18, height 5\' 6"  (1.676 m), weight 106.6 kg (235 lb), last menstrual period 11/29/2016, SpO2 98 %, unknown if currently breastfeeding.  Physical Exam:  General: alert and no distress Lochia: appropriate Uterine Fundus: firm Incision: healing well DVT Evaluation: No evidence of DVT seen on physical exam.  Recent Labs    09/08/17 0403  HGB 9.8*  HCT 28.7*    Assessment/Plan: Discharge home.  Routine care.  D/C with motrin, percocet and PNV.  F/u 2 weeks.     LOS: 3 days   Kathryn Moyer 09/10/2017, 7:25 AM

## 2017-09-10 NOTE — Discharge Summary (Signed)
OB Discharge Summary     Patient Name: Kathryn Moyer DOB: January 12, 1986 MRN: [id number]  Date of admission: 09/07/2017 Delivering MD: Huel Cote   Date of discharge: 09/10/2017  Admitting diagnosis: repeat c-section  Intrauterine pregnancy: [redacted]w[redacted]d     Secondary diagnosis:  Active Problems:   S/P repeat low transverse C-section  Additional problems:N/A     Discharge diagnosis: Term Pregnancy Delivered                                                                                                Post partum procedures:none  Augmentation: none  Complications: None  Hospital course:  Sceduled C/S   32 y.o. yo G2P2002 at [redacted]w[redacted]d was admitted to the hospital 09/07/2017 for scheduled cesarean section with the following indication:Elective Repeat.  Membrane Rupture Time/Date: 8:23 AM ,09/07/2017   Patient delivered a Viable infant.09/07/2017  Details of operation can be found in separate operative note.  Pateint had an uncomplicated postpartum course.  She is ambulating, tolerating a regular diet, passing flatus, and urinating well. Patient is discharged home in stable condition on  09/10/17         Physical exam  Vitals:   09/08/17 1826 09/09/17 0655 09/09/17 1729 09/10/17 0616  BP: 127/74 118/77 126/65 131/70  Pulse: 86 95 91 92  Resp: 18 14 16 18   Temp: 98.1 F (36.7 C) 98.5 F (36.9 C) 98.9 F (37.2 C) 98 F (36.7 C)  TempSrc: Oral Oral Oral Oral  SpO2:  95% 98%   Weight:      Height:       General: alert and no distress Lochia: appropriate Uterine Fundus: firm Incision: Healing well with no significant drainage DVT Evaluation: No evidence of DVT seen on physical exam. Labs: Lab Results  Component Value Date   WBC 16.9 (H) 09/08/2017   HGB 9.8 (L) 09/08/2017   HCT 28.7 (L) 09/08/2017   MCV 89.4 09/08/2017   PLT 266 09/08/2017   CMP Latest Ref Rng & Units 08/17/2013  Glucose 70 - 99 mg/dL 96  BUN 6 - 23 mg/dL 7  Creatinine 7.82 - 9.56 mg/dL 2.13  Sodium 086 - 578  mEq/L 138  Potassium 3.7 - 5.3 mEq/L 3.8  Chloride 96 - 112 mEq/L 101  CO2 19 - 32 mEq/L 21  Calcium 8.4 - 10.5 mg/dL 9.7  Total Protein 6.0 - 8.3 g/dL 7.5  Total Bilirubin 0.3 - 1.2 mg/dL <4.6(N)  Alkaline Phos 39 - 117 U/L 57  AST 0 - 37 U/L 11  ALT 0 - 35 U/L 10    Discharge instruction: per After Visit Summary and "Baby and Me Booklet".  After visit meds:  Allergies as of 09/10/2017      Reactions   Other Hives, Rash   *foods with high acidity*      Medication List    TAKE these medications   ibuprofen 600 MG tablet Commonly known as:  ADVIL,MOTRIN Take 1 tablet (600 mg total) by mouth every 6 (six) hours as needed.   multivitamin with minerals tablet Take 1 tablet by mouth daily.   oxyCODONE  5 MG immediate release tablet Commonly known as:  Oxy IR/ROXICODONE Take 1 tablet (5 mg total) by mouth every 6 (six) hours as needed for severe pain (pain scale 4-7).       Diet: routine diet  Activity: Advance as tolerated. Pelvic rest for 6 weeks.   Outpatient follow up:2 and 6 weeks Follow up Appt:No future appointments. Follow up Visit:No Follow-up on file.  Postpartum contraception: Undecided  Newborn Data: Live born female  Birth Weight: 8 lb 4.5 oz (3755 g) APGAR: 8, 9  Newborn Delivery   Birth date/time:  09/07/2017 08:24:00 Delivery type:  C-Section, Low Transverse C-section categorization:  Repeat     Baby Feeding: Bottle and Breast Disposition:home with mother   09/10/2017 Sherian ReinJody Bovard-Stuckert, MD

## 2017-09-10 NOTE — Lactation Note (Addendum)
This note was copied from a baby's chart. Lactation Consultation Note  Patient Name: Boy Wendall PapaKrystal Backer ZOXWR'UToday's Date: 09/10/2017 Reason for consult: Follow-up assessment;Hyperbilirubinemia;Other (Comment);Infant weight loss(baby going home on single photio tx )  Due to Positive DAT , baby going home on single photo tx.  Moms milk in bilaterally and she has been breast feeding and supplementing with EBM.  LC reviewed sore nipple and engorgement prevention and tx.  Per mom active with WIC, and [facility]  Mother informed of post-discharge support and given phone number to the lactation department, including services for phone call assistance; out-patient appointments; and breastfeeding support group. List of other breastfeeding resources in the community given in the handout. Encouraged mother to call for problems or concerns related to breastfeeding.  [facility]  DEBP [facility] this am. Mom aware.  Plan B is [facility] , [facility] .    Maternal Data    Feeding    LATCH Score                   Interventions    Lactation Tools Discussed/Used     Consult Status      Matilde SprangMargaret Ann Letecia Arps 09/10/2017, 2:09 PM

## 2017-09-10 NOTE — Lactation Note (Signed)
This note was copied from a baby's chart. Lactation Consultation Note  Patient Name: Kathryn Moyer ZOXWR'UToday's Date: 09/10/2017  [facility], and received the $30.oo from mom.  LC explained the set up of the DEBP loaner and mom aware she needs to return on within 12 days.  DEBP WIC referral sent and WIC aware [facility].    Maternal Data    Feeding    LATCH Score                   Interventions    Lactation Tools Discussed/Used     Consult Status      Matilde SprangMargaret Ann Bart Ashford 09/10/2017, 3:51 PM

## 2017-12-17 ENCOUNTER — Ambulatory Visit (HOSPITAL_COMMUNITY)
Admission: EM | Admit: 2017-12-17 | Discharge: 2017-12-17 | Disposition: A | Discharge status: Home / Self Care | Payer: Medicaid Other | Attending: Family Medicine | Admitting: Family Medicine

## 2017-12-17 ENCOUNTER — Encounter (HOSPITAL_COMMUNITY): Payer: Self-pay | Admitting: Family Medicine

## 2017-12-17 DIAGNOSIS — J019 Acute sinusitis, unspecified: Secondary | ICD-10-CM | POA: Diagnosis not present

## 2017-12-17 MED ORDER — AMOXICILLIN-POT CLAVULANATE 875-125 MG PO TABS: 1.0000 | ORAL_TABLET | Freq: Two times a day (BID) | ORAL | 0 refills | Status: AC

## 2017-12-17 MED ORDER — FEXOFENADINE HCL 60 MG PO TABS: 60.0000 mg | ORAL_TABLET | Freq: Two times a day (BID) | ORAL | 0 refills | Status: DC

## 2017-12-17 NOTE — Discharge Instructions (Signed)
Push fluids to ensure adequate hydration and keep secretions thin.  Please be sure to significantly increase your fluid intake if you start use of the Allegra while breastfeeding. Complete course of antibiotics.   If symptoms worsen or do not improve in the next week to return to be seen or to follow up with your PCP.

## 2017-12-17 NOTE — ED Provider Notes (Signed)
[facility]    CSN: [id number] Arrival date & time: 12/17/17  1452     History   Chief Complaint Chief Complaint  Patient presents with  . Nasal Congestion  . Allergies    HPI Kathryn Moyer is a 32 y.o. female.   Kathryn Moyer presents with complaints of allergy symptoms for the past month to include headache, facial pressure, congestion with green nasal drainage. Yesterday develop sore throat and right ear pain. Pain with swallowing. No known fevers. Has not taken any medications. Breastfeeding. No known ill contacts. No skin rash. No history of asthma.    ROS per HPI.      Past Medical History:  Diagnosis Date  . Hx of varicella   . Medical history non-contributory     Patient Active Problem List   Diagnosis Date Noted  . S/P repeat low transverse C-section 09/07/2017  . S/P C-section 04/02/2014  . Normal pregnancy 03/31/2014    Past Surgical History:  Procedure Laterality Date  . ANTERIOR CRUCIATE LIGAMENT REPAIR     R klnee  . CESAREAN SECTION N/A 04/02/2014   Procedure: CESAREAN SECTION;  Surgeon: Oliver Pila, MD;  Location: [facility];  Service: Obstetrics;  Laterality: N/A;  . CESAREAN SECTION N/A 09/07/2017   Procedure: REPEAT CESAREAN SECTION;  Surgeon: Huel Cote, MD;  Location: [facility] BIRTHING SUIT[facility];  Service: Obstetrics;  Laterality: N/A;  Tracey RNFA    OB History    Gravida  2   Para  2   Term  2   Preterm      AB      Living  2     SAB      TAB      Ectopic      Multiple  0   Live Births  2            Home Medications    Prior to Admission medications   Medication Sig Start Date End Date Taking? Authorizing Provider  amoxicillin-clavulanate (AUGMENTIN) 875-125 MG tablet Take 1 tablet by mouth every 12 (twelve) hours for 10 days. 12/17/17 12/27/17  Georgetta Haber, NP  fexofenadine (ALLEGRA) 60 MG tablet Take 1 tablet (60 mg total) by mouth 2 (two) times daily. 12/17/17   Georgetta Haber, NP  ibuprofen  (ADVIL,MOTRIN) 600 MG tablet Take 1 tablet (600 mg total) by mouth every 6 (six) hours as needed. 09/10/17   Bovard-Stuckert, Augusto Gamble, MD  Multiple Vitamins-Minerals (MULTIVITAMIN WITH MINERALS) tablet Take 1 tablet by mouth daily. 09/10/17   Bovard-StuckertAugusto Gamble, MD    Family History Family History  Problem Relation Age of Onset  . Hearing loss Paternal Grandmother        deaf  . Hearing loss Paternal Grandfather        deaf  . Diabetes Paternal Grandfather   . Hypertension Mother   . Hypertension Father   . Cancer Maternal Grandmother        lung  . Diabetes Maternal Grandfather   . Autism Son     Social History Social History   Tobacco Use  . Smoking status: Former Smoker    Packs/day: 0.00    Years: 4.00    Pack years: 0.00    Types: Cigarettes    Last attempt to quit: 08/31/2013    Years since quitting: 4.2  . Smokeless tobacco: Never Used  Substance Use Topics  . Alcohol use: No  . Drug use: No     Allergies   Other  Review of Systems Review of Systems   Physical Exam Triage Vital Signs ED Triage Vitals  Enc Vitals Group     BP 12/17/17 1537 122/73     Pulse Rate 12/17/17 1537 81     Resp 12/17/17 1537 18     Temp 12/17/17 1537 98.5 F (36.9 C)     Temp Source 12/17/17 1537 Oral     SpO2 12/17/17 1537 100 %     Weight --      Height --      Head Circumference --      Peak Flow --      Pain Score 12/17/17 1535 8     Pain Loc --      Pain Edu? --      Excl. in GC? --    No data found.  Updated Vital Signs BP 122/73   Pulse 81   Temp 98.5 F (36.9 C) (Oral)   Resp 18   SpO2 100%   Breastfeeding? Yes   Physical Exam  Constitutional: She is oriented to person, place, and time. She appears well-developed and well-nourished. She appears ill. No distress.  HENT:  Head: Normocephalic and atraumatic.  Right Ear: External ear and ear canal normal. A middle ear effusion is present.  Left Ear: External ear and ear canal normal. A middle ear  effusion is present.  Nose: Rhinorrhea present.  Mouth/Throat: Uvula is midline, oropharynx is clear and moist and mucous membranes are normal. No tonsillar exudate.  Sinus pressure but without specific tenderness on palpation   Eyes: Pupils are equal, round, and reactive to light. Conjunctivae and EOM are normal.  Cardiovascular: Normal rate, regular rhythm and normal heart sounds.  Pulmonary/Chest: Effort normal and breath sounds normal.  Neurological: She is alert and oriented to person, place, and time.  Skin: Skin is warm and dry.     UC Treatments / Results  Labs (all labs ordered are listed, but only abnormal results are displayed) Labs Reviewed - No data to display  EKG None  Radiology No results found.  Procedures Procedures (including critical care time)  Medications Ordered in UC Medications - No data to display  Initial Impression / Assessment and Plan / UC Course  I have reviewed the triage vital signs and the nursing notes.  Pertinent labs & imaging results that were available during my care of the patient were reviewed by me and considered in my medical decision making (see chart for details).     1 week of persistent and worsening of sinus symptoms. Increase fluid intake. Per [employer] compatible with breastfeeding- encouraged increased fluid intake. Course of augmentin for sinusitis at this time. Tylenol as needed for pain. Return precautions provided. Patient verbalized understanding and agreeable to plan.    Final Clinical Impressions(s) / UC Diagnoses   Final diagnoses:  Acute sinusitis, recurrence not specified, unspecified location     Discharge Instructions     Push fluids to ensure adequate hydration and keep secretions thin.  Please be sure to significantly increase your fluid intake if you start use of the Allegra while breastfeeding. Complete course of antibiotics.   If symptoms worsen or do not improve in the next week to return  to be seen or to follow up with your PCP.    ED Prescriptions    Medication Sig Dispense Auth. Provider   amoxicillin-clavulanate (AUGMENTIN) 875-125 MG tablet Take 1 tablet by mouth every 12 (twelve) hours for 10 days. 20 tablet Linus Mako  B, NP   fexofenadine (ALLEGRA) 60 MG tablet Take 1 tablet (60 mg total) by mouth 2 (two) times daily. 20 tablet Georgetta Haber, NP     Controlled Substance Prescriptions  Controlled Substance Registry consulted? Not Applicable   Georgetta Haber, NP 12/17/17 [phone]

## 2017-12-17 NOTE — ED Triage Notes (Signed)
Pt sts that she has been battling allergies x 1 month and now she is having more nasal congestion, headache and ear pain.

## 2018-05-23 ENCOUNTER — Ambulatory Visit: Payer: Medicaid Other | Admitting: Nurse Practitioner

## 2018-05-23 ENCOUNTER — Encounter: Payer: Self-pay | Admitting: Nurse Practitioner

## 2018-05-23 VITALS — BP 102/60 | HR 71 | Temp 97.9°F | Ht 65.25 in | Wt 225.4 lb

## 2018-05-23 DIAGNOSIS — H6690 Otitis media, unspecified, unspecified ear: Secondary | ICD-10-CM

## 2018-05-23 DIAGNOSIS — J3089 Other allergic rhinitis: Secondary | ICD-10-CM

## 2018-05-23 DIAGNOSIS — H6691 Otitis media, unspecified, right ear: Secondary | ICD-10-CM

## 2018-05-23 DIAGNOSIS — R5383 Other fatigue: Secondary | ICD-10-CM | POA: Diagnosis not present

## 2018-05-23 DIAGNOSIS — E559 Vitamin D deficiency, unspecified: Secondary | ICD-10-CM

## 2018-05-23 LAB — POCT URINE PREGNANCY: PREG TEST UR: NEGATIVE

## 2018-05-23 MED ORDER — AMOXICILLIN 500 MG PO TABS: 500.0000 mg | ORAL_TABLET | Freq: Two times a day (BID) | ORAL | 0 refills | Status: DC

## 2018-05-23 NOTE — Progress Notes (Addendum)
  Subjective:     Patient ID: Kathryn Moyer , female    DOB: 16-Dec-1985 , 32 y.o.   MRN: [id number]   Fatigue - she feels really tired for one month.  She is postpartum 9 months.  She recently had a promotion at her job and working a little more.  Feels like her anxiety level for her new baby after having her first son being in the NICU. She has a poor diet, with bad choices.    She is breastfeeding. LMP - February/March  URI   This is a new problem. The current episode started 1 to 4 weeks ago. There has been no fever. Associated symptoms include congestion and headaches. Pertinent negatives include no coughing, diarrhea, nausea or sore throat. Treatments tried: saline nasal spray.     Past Medical History:  Diagnosis Date  . Hx of varicella   . Medical history non-contributory      No current outpatient medications on file.   Allergies  Allergen Reactions  . Other Hives and Rash    *foods with high acidity*     Review of Systems  Constitutional: Positive for fatigue.  HENT: Positive for congestion. Negative for sore throat.   Respiratory: Negative.  Negative for cough.   Cardiovascular: Negative.   Gastrointestinal: Negative for diarrhea and nausea.  Neurological: Positive for headaches.  Hematological: Negative.   Psychiatric/Behavioral: Negative.      Today's Vitals   05/23/18 0947  BP: 102/60  Pulse: 71  Temp: 97.9 F (36.6 C)  TempSrc: Oral  SpO2: 97%  Weight: 225 lb 6.4 oz (102.2 kg)  Height: 5' 5.25" (1.657 m)  PainSc: 0-No pain   Body mass index is 37.22 kg/m.   Objective:  Physical Exam  Constitutional: She appears well-developed and well-nourished.  Cardiovascular: Normal rate, regular rhythm, normal heart sounds and intact distal pulses.  Pulmonary/Chest: Effort normal and breath sounds normal.  Skin: Skin is warm and dry.  Psychiatric: She has a normal mood and affect.  Nursing note and vitals reviewed.       Assessment And Plan:     1.  Fatigue, unspecified type  Will check for metabolic causes. She is under increase pressure with her new baby who is 9 months and her son who is Autistic. Also had a recent promotion.   Negative urinary pregnancy test  - CBC with Diff - BMP8+eGFR - TSH - T3, free - T4 - Vitamin B12 - Vitamin D 1,25 Dihydroxy - POCT Urine Pregnancy  2. Non-seasonal allergic rhinitis, unspecified trigger  Continue with normal saline nasal spray   Also consider local honey.  - amoxicillin (AMOXIL) 500 MG tablet; Take 1 tablet (500 mg total) by mouth 2 (two) times daily.  Dispense: 14 tablet; Refill: 0  3. Acute otitis media, unspecified otitis media type  Right ear with erythema since been sick for 1 month will treat with Amoxicillin  Advised to monitor for loose stools with her baby since she is Lactating.    Minette Brine, FNP

## 2018-05-24 LAB — BMP8+EGFR
BUN / CREAT RATIO: 12 (ref 9–23)
BUN: 9 mg/dL (ref 6–20)
CO2: 23 mmol/L (ref 20–29)
CREATININE: 0.76 mg/dL (ref 0.57–1.00)
Calcium: 9.6 mg/dL (ref 8.7–10.2)
Chloride: 104 mmol/L (ref 96–106)
GFR calc non Af Amer: 103 mL/min/{1.73_m2} (ref >59)
GFR, EST AFRICAN AMERICAN: 118 mL/min/{1.73_m2} (ref >59)
GLUCOSE: 82 mg/dL (ref 65–99)
Potassium: 4.2 mmol/L (ref 3.5–5.2)
SODIUM: 141 mmol/L (ref 134–144)

## 2018-05-24 LAB — CBC WITH DIFFERENTIAL/PLATELET
BASOS ABS: 0.1 10*3/uL (ref 0.0–0.2)
Basos: 1 %
EOS (ABSOLUTE): 0.4 10*3/uL (ref 0.0–0.4)
Eos: 5 %
HEMOGLOBIN: 12 g/dL (ref 11.1–15.9)
Hematocrit: 34.3 % (ref 34.0–46.6)
Immature Grans (Abs): 0 10*3/uL (ref 0.0–0.1)
Immature Granulocytes: 0 %
LYMPHS: 36 %
Lymphocytes Absolute: 2.8 10*3/uL (ref 0.7–3.1)
MCH: 31.1 pg (ref 26.6–33.0)
MCHC: 35 g/dL (ref 31.5–35.7)
MCV: 89 fL (ref 79–97)
MONOS ABS: 0.6 10*3/uL (ref 0.1–0.9)
Monocytes: 8 %
NEUTROS PCT: 50 %
Neutrophils Absolute: 4 10*3/uL (ref 1.4–7.0)
Platelets: 383 10*3/uL (ref 150–450)
RBC: 3.86 x10E6/uL (ref 3.77–5.28)
RDW: 13.9 % (ref 12.3–15.4)
WBC: 7.8 10*3/uL (ref 3.4–10.8)

## 2018-05-24 LAB — VITAMIN D 25 HYDROXY (VIT D DEFICIENCY, FRACTURES): VIT D 25 HYDROXY: 10 ng/mL — AB (ref 30.0–100.0)

## 2018-05-24 LAB — TSH: TSH: 0.513 u[IU]/mL (ref 0.450–4.500)

## 2018-05-24 LAB — T3, FREE: T3, Free: 3.1 pg/mL (ref 2.0–4.4)

## 2018-05-24 LAB — T4: T4, Total: 6.3 ug/dL (ref 4.5–12.0)

## 2018-05-24 LAB — VITAMIN B12: VITAMIN B 12: 390 pg/mL (ref 232–1245)

## 2018-05-24 NOTE — Progress Notes (Signed)
Send vitamin d 50,000 units twice a week # 24 tabs with one refill. Thanks

## 2018-05-27 ENCOUNTER — Other Ambulatory Visit: Payer: Self-pay

## 2018-05-27 DIAGNOSIS — R5383 Other fatigue: Secondary | ICD-10-CM

## 2018-05-27 MED ORDER — VITAMIN D (ERGOCALCIFEROL) 1.25 MG (50000 UNIT) PO CAPS: 50000.0000 [IU] | ORAL_CAPSULE | ORAL | 1 refills | Status: DC

## 2018-07-09 DIAGNOSIS — E559 Vitamin D deficiency, unspecified: Secondary | ICD-10-CM | POA: Insufficient documentation

## 2018-07-09 DIAGNOSIS — R5383 Other fatigue: Secondary | ICD-10-CM | POA: Insufficient documentation

## 2018-08-08 ENCOUNTER — Ambulatory Visit (INDEPENDENT_AMBULATORY_CARE_PROVIDER_SITE_OTHER): Payer: Self-pay | Admitting: Nurse Practitioner

## 2018-08-08 ENCOUNTER — Encounter: Payer: Self-pay | Admitting: Nurse Practitioner

## 2018-08-08 VITALS — BP 110/80 | HR 91 | Temp 98.4°F | Ht 65.5 in | Wt 218.0 lb

## 2018-08-08 DIAGNOSIS — T7840XA Allergy, unspecified, initial encounter: Secondary | ICD-10-CM

## 2018-08-08 DIAGNOSIS — M79641 Pain in right hand: Secondary | ICD-10-CM

## 2018-08-08 MED ORDER — TRIAMCINOLONE ACETONIDE 40 MG/ML IJ SUSP
60.0000 mg | Freq: Once | INTRAMUSCULAR | Status: AC
  Administered 2018-08-08: 60 mg via INTRAMUSCULAR

## 2018-08-08 NOTE — Progress Notes (Signed)
Subjective:     Patient ID: Kathryn Moyer , female    DOB: Oct 09, 1985 , 33 y.o.   MRN: [id number]   Chief Complaint  Patient presents with  . Allergic Reaction    patient states she broke out in hives yesterday after drinking orange juice and she took has taken some benadryl.     HPI  Allergic Reaction  This is a new problem. The current episode started 2 days ago. The problem occurs constantly. The patient was exposed to food (orange juice). Associated symptoms include itching. Pertinent negatives include no abdominal pain, chest pain, coughing, rash or wheezing. There is no swelling present. Past treatments include diphenhydramine. Her past medical history is significant for food allergies. There is no history of asthma.  Hand Pain   The incident occurred more than 1 week ago. There was no injury mechanism. The pain is present in the right hand. The quality of the pain is described as stabbing and shooting. The pain does not radiate. The pain is moderate. The pain has been intermittent since the incident. Pertinent negatives include no chest pain. Nothing aggravates the symptoms. She has tried nothing for the symptoms. Improvement on treatment: she is right handed.     Past Medical History:  Diagnosis Date  . Hx of varicella   . Medical history non-contributory      Family History  Problem Relation Age of Onset  . Hearing loss Paternal Grandmother        deaf  . Hearing loss Paternal Grandfather        deaf  . Diabetes Paternal Grandfather   . Hypertension Mother   . Hypertension Father   . Cancer Maternal Grandmother        lung  . Diabetes Maternal Grandfather   . Autism Son      Current Outpatient Medications:  Marland Kitchen.  Vitamin D, Ergocalciferol, (DRISDOL) 50000 units CAPS capsule, Take 1 capsule (50,000 Units total) by mouth every 7 (seven) days., Disp: 24 capsule, Rfl: 1   Allergies  Allergen Reactions  . Other Hives and Rash    *foods with high acidity*     Review  of Systems  Constitutional: Negative.   HENT: Positive for congestion.   Respiratory: Negative for cough, shortness of breath and wheezing.   Cardiovascular: Negative for chest pain, palpitations and leg swelling.  Gastrointestinal: Negative for abdominal pain and nausea.  Skin: Positive for itching. Negative for rash.  Allergic/Immunologic: Positive for food allergies.  Neurological: Negative for dizziness.     Today's Vitals   08/08/18 1614  BP: 110/80  Pulse: 91  Temp: 98.4 F (36.9 C)  TempSrc: Oral  SpO2: 96%  Weight: 218 lb (98.9 kg)  Height: 5' 5.5" (1.664 m)  PainSc: 0-No pain   Body mass index is 35.73 kg/m.   Objective:  Physical Exam Vitals signs reviewed.  Constitutional:      Appearance: She is well-developed.  HENT:     Head: Normocephalic and atraumatic.  Cardiovascular:     Rate and Rhythm: Normal rate and regular rhythm.     Pulses: Normal pulses.     Heart sounds: Normal heart sounds. No murmur.  Pulmonary:     Effort: Pulmonary effort is normal. No respiratory distress.     Breath sounds: No wheezing, rhonchi or rales.  Chest:     Chest wall: No tenderness.  Musculoskeletal: Normal range of motion.        General: Tenderness (right wrist) present.  Skin:  General: Skin is warm and dry.     Capillary Refill: Capillary refill takes less than 2 seconds.  Neurological:     General: No focal deficit present.     Mental Status: She is alert and oriented to person, place, and time.     Cranial Nerves: No cranial nerve deficit.       Assessment And Plan:     1. Allergic reaction, initial encounter  Encouraged to take benadryl prior to eating any foods she may have hives with or avoid completely as long as she is not having dyspnea or tongue swelling. - triamcinolone acetonide (KENALOG-40) injection 60 mg  2. Right hand pain  Persistent right wrist pain  Positive Tinel's  Will refer to hand specialist for further evaluation  Concerning  for Carpal Tunnel - Ambulatory referral to Hand Surgery      Arnette Felts, FNP

## 2018-11-25 ENCOUNTER — Encounter: Payer: Self-pay | Admitting: Nurse Practitioner

## 2018-11-25 ENCOUNTER — Other Ambulatory Visit: Payer: Self-pay

## 2018-11-25 ENCOUNTER — Ambulatory Visit: Payer: Self-pay | Admitting: Nurse Practitioner

## 2018-11-25 DIAGNOSIS — B309 Viral conjunctivitis, unspecified: Secondary | ICD-10-CM

## 2018-11-25 DIAGNOSIS — J302 Other seasonal allergic rhinitis: Secondary | ICD-10-CM

## 2018-11-25 MED ORDER — LORATADINE 10 MG PO TABS: 10.0000 mg | ORAL_TABLET | Freq: Every day | ORAL | 2 refills | Status: DC

## 2018-11-25 MED ORDER — ERYTHROMYCIN 5 MG/GM OP OINT: 1.0000 "application " | TOPICAL_OINTMENT | Freq: Every day | OPHTHALMIC | 0 refills | Status: DC

## 2018-11-25 NOTE — Progress Notes (Signed)
Virtual Visit via Video Note   This visit type was conducted due to national recommendations for restrictions regarding the COVID-19 Pandemic (e.g. social distancing) in an effort to limit this patient's exposure and mitigate transmission in our community.  This format is felt to be most appropriate for this patient at this time.  All issues noted in this document were discussed and addressed.  No physical exam was performed (except for noted visual exam findings with Video Visits).  Please refer to the patient's chart (MyChart message for video visits and phone note for telephone visits) for the patient's consent to telehealth [facility].  Date:  12/15/2018   ID:  Kathryn Moyer, DOB 24-Sep-1985, MRN [id number]  Patient Location:  Home  Provider location:   Office    Chief Complaint:  Left eye discharge  History of Present Illness:    Kathryn Moyer is a 33 y.o. female who presents via video conferencing for a telehealth visit today.    The patient does not have symptoms concerning for COVID-19 infection (fever, chills, cough, or new shortness of breath).   She is working at [employer].      Past Medical History:  Diagnosis Date  . Hx of varicella   . Medical history non-contributory    Past Surgical History:  Procedure Laterality Date  . ANTERIOR CRUCIATE LIGAMENT REPAIR     R [place]  . CESAREAN SECTION N/A 04/02/2014   Procedure: CESAREAN SECTION;  Surgeon: Oliver PilaKathy W Richardson, MD;  Location: [facility];  Service: Obstetrics;  Laterality: N/A;  . CESAREAN SECTION N/A 09/07/2017   Procedure: REPEAT CESAREAN SECTION;  Surgeon: Huel Coteichardson, Kathy, MD;  [facility];  Service: Obstetrics;  Laterality: N/A;  Tracey RNFA     Current Meds  Medication Sig  . Vitamin D, Ergocalciferol, (DRISDOL) 50000 units CAPS capsule Take 1 capsule (50,000 Units total) by mouth every 7 (seven) days.     Allergies:   Other   Social History   Tobacco Use  . Smoking status: Former  Smoker    Packs/day: 0.00    Years: 4.00    Pack years: 0.00    Types: Cigarettes    Last attempt to quit: 08/31/2013    Years since quitting: 5.2  . Smokeless tobacco: Never Used  Substance Use Topics  . Alcohol use: No  . Drug use: No     Family Hx: The patient's family history includes Autism in her son; Cancer in her maternal grandmother; Diabetes in her maternal grandfather and paternal grandfather; Hearing loss in her paternal grandfather and paternal grandmother; Hypertension in her father and mother.  ROS:   Please see the history of present illness.    Review of Systems  Constitutional: Negative for fever.  HENT: Positive for congestion. Negative for sore throat.   Eyes: Positive for discharge and redness. Negative for blurred vision, double vision, photophobia and pain.  Respiratory: Negative.   Cardiovascular: Negative.   Gastrointestinal: Negative for nausea and vomiting.  Skin: Negative.     All other systems reviewed and are negative.   Labs/Other Tests and Data Reviewed:    Recent Labs: 05/23/2018: BUN 9; Creatinine, Ser 0.76; Hemoglobin 12.0; Platelets 383; Potassium 4.2; Sodium 141; TSH 0.513   Recent Lipid Panel No results found for: CHOL, TRIG, HDL, CHOLHDL, LDLCALC, LDLDIRECT  Wt Readings from Last 3 Encounters:  08/08/18 218 lb (98.9 kg)  05/23/18 225 lb 6.4 oz (102.2 kg)  09/07/17 235 lb (106.6 kg)  Exam:    Vital Signs:  There were no vitals taken for this visit.    Physical Exam  Constitutional: She is well-developed, well-nourished, and in no distress.  Eyes:  Unable to see redness to eye due to video call  Pulmonary/Chest: Effort normal. No respiratory distress.    ASSESSMENT & PLAN:   1. Viral conjunctivitis of left eye  Due to the symptomology will treat with erythromycin,  Typically viral conjunctivitis takes its course however due to possible exposure and highly contangious will treat - erythromycin ophthalmic ointment;  Place 1 application into the left eye at bedtime.  Dispense: 3.5 g; Refill: 0  2. Seasonal allergies  Refill for loratadine - loratadine (CLARITIN) 10 MG tablet; Take 1 tablet (10 mg total) by mouth daily.  Dispense: 30 tablet; Refill: 2    COVID-19 Education: The signs and symptoms of COVID-19 were discussed with the patient and how to seek care for testing (follow up with PCP or arrange E-visit).  The importance of social distancing was discussed today.  Patient Risk:   After full review of this patients clinical status, I feel that they are at least low risk at this time.  Time:   Today, I have spent 15 minutes/ seconds with the patient with telehealth technology discussing above diagnoses.     Medication Adjustments/Labs and Tests Ordered: Current medicines are reviewed at length with the patient today.  Concerns regarding medicines are outlined above.   Tests Ordered: No orders of the defined types were placed in this encounter.   Medication Changes: Meds ordered this encounter  Medications  . erythromycin ophthalmic ointment    Sig: Place 1 application into the left eye at bedtime.    Dispense:  3.5 g    Refill:  0  . loratadine (CLARITIN) 10 MG tablet    Sig: Take 1 tablet (10 mg total) by mouth daily.    Dispense:  30 tablet    Refill:  2    Disposition:  Follow up prn  Signed, Arnette Felts, FNP

## 2018-11-26 ENCOUNTER — Ambulatory Visit: Payer: Medicaid Other | Admitting: Nurse Practitioner

## 2019-09-04 ENCOUNTER — Other Ambulatory Visit: Payer: Self-pay

## 2019-09-04 ENCOUNTER — Other Ambulatory Visit (HOSPITAL_COMMUNITY)
Admission: RE | Admit: 2019-09-04 | Discharge: 2019-09-04 | Disposition: A | Discharge status: Home / Self Care | Payer: No Typology Code available for payment source | Source: Ambulatory Visit | Attending: Nurse Practitioner | Admitting: Nurse Practitioner

## 2019-09-04 ENCOUNTER — Encounter: Payer: Self-pay | Admitting: Nurse Practitioner

## 2019-09-04 ENCOUNTER — Ambulatory Visit: Payer: No Typology Code available for payment source | Admitting: Nurse Practitioner

## 2019-09-04 VITALS — BP 114/78 | HR 92 | Temp 98.7°F | Wt 182.6 lb

## 2019-09-04 DIAGNOSIS — N898 Other specified noninflammatory disorders of vagina: Secondary | ICD-10-CM

## 2019-09-04 MED ORDER — FLUCONAZOLE 100 MG PO TABS: ORAL_TABLET | ORAL | 0 refills | Status: DC

## 2019-09-04 MED ORDER — AZITHROMYCIN 250 MG PO TABS: ORAL_TABLET | ORAL | 0 refills | Status: AC

## 2019-09-04 NOTE — Progress Notes (Signed)
This visit occurred during the SARS-CoV-2 public health emergency.  Safety protocols were in place, including screening questions prior to the visit, additional usage of staff PPE, and extensive cleaning of exam room while observing appropriate contact time as indicated for disinfecting solutions.  Subjective:     Patient ID: Kathryn Moyer , female    DOB: 1985-12-01 , 34 y.o.   MRN: [id number]   Chief Complaint  Patient presents with  . Gynecologic Exam    HPI  She puts a capsize of bleach in her water, she was unable to smell. This started last week.  Vaginal irritation started on Sunday then has cottage cheese discharge.  She works at [employer].  Not tested for covid in last 2 weeks.    Gynecologic Exam The patient's primary symptoms include vaginal discharge. The patient's pertinent negatives include no genital lesions or vaginal bleeding. The current episode started in the past 7 days. Pertinent negatives include no abdominal pain or headaches. Associated symptoms comments: itching. The vaginal discharge was copious (cottage cheese). Exacerbated by: after taking a bath with bleach in it. She is sexually active with multiple partners. No, her partner does not have an STD. Her menstrual history has been regular. There is no history of miscarriage.     Past Medical History:  Diagnosis Date  . Hx of varicella   . Medical history non-contributory      Family History  Problem Relation Age of Onset  . Hearing loss Paternal Grandmother        deaf  . Hearing loss Paternal Grandfather        deaf  . Diabetes Paternal Grandfather   . Hypertension Mother   . Hypertension Father   . Cancer Maternal Grandmother        lung  . Diabetes Maternal Grandfather   . Autism Son      Current Outpatient Medications:  .  loratadine (CLARITIN) 10 MG tablet, Take 1 tablet (10 mg total) by mouth daily. (Patient not taking: Reported on 09/04/2019), Disp: 30 tablet, Rfl: 2 .  Vitamin D,  Ergocalciferol, (DRISDOL) 50000 units CAPS capsule, Take 1 capsule (50,000 Units total) by mouth every 7 (seven) days. (Patient not taking: Reported on 09/04/2019), Disp: 24 capsule, Rfl: 1   Allergies  Allergen Reactions  . Other Hives and Rash    *foods with high acidity*     Review of Systems  Constitutional: Negative.   Respiratory: Negative.   Cardiovascular: Negative.  Negative for chest pain, palpitations and leg swelling.  Gastrointestinal: Negative for abdominal pain.  Genitourinary: Positive for vaginal discharge.  Skin:       She has problems with boils, gets them around her inner thighs and buttocks.    Neurological: Negative for dizziness and headaches.  Psychiatric/Behavioral: Negative.  Negative for agitation.     Today's Vitals   09/04/19 1616  BP: 114/78  Pulse: 92  Temp: 98.7 F (37.1 C)  TempSrc: Oral  Weight: 182 lb 9.6 oz (82.8 kg)  PainSc: 0-No pain   Body mass index is 29.92 kg/m.   Objective:  Physical Exam Constitutional:      General: She is not in acute distress.    Appearance: Normal appearance.  Cardiovascular:     Rate and Rhythm: Normal rate and regular rhythm.     Pulses: Normal pulses.     Heart sounds: Normal heart sounds. No murmur.  Pulmonary:     Effort: Pulmonary effort is normal. No respiratory distress.  Breath sounds: Normal breath sounds.  Genitourinary:    Vagina: Normal.     Uterus: Normal.      Comments: Yellow green discharge present to vaginal wall and cervix Neurological:     General: No focal deficit present.     Mental Status: She is alert and oriented to person, place, and time.         Assessment And Plan:     1. Vaginal discharge  Yellow green thick discharge present  Will treat for yeast infection and empirical for chlamydia  Sent swab to check for STDs  Advised to remain abstinent until results and possible additional treatment - fluconazole (DIFLUCAN) 100 MG tablet; Take 1 tablet now by mouth  repeat in 5 days.  Dispense: 2 tablet; Refill: 0 - azithromycin (ZITHROMAX) 250 MG tablet; Take 4 tablets by mouth once  Dispense: 4 tablet; Refill: 0 - Cervicovaginal ancillary only   Arnette Felts, FNP    THE PATIENT IS ENCOURAGED TO PRACTICE SOCIAL DISTANCING DUE TO THE COVID-19 PANDEMIC.

## 2019-09-08 ENCOUNTER — Other Ambulatory Visit: Payer: Self-pay | Admitting: Nurse Practitioner

## 2019-09-08 DIAGNOSIS — B9689 Other specified bacterial agents as the cause of diseases classified elsewhere: Secondary | ICD-10-CM

## 2019-09-08 LAB — CERVICOVAGINAL ANCILLARY ONLY
Bacterial Vaginitis (gardnerella): POSITIVE — AB
Candida Glabrata: NEGATIVE
Candida Vaginitis: POSITIVE — AB
Chlamydia: NEGATIVE
Comment: NEGATIVE
Comment: NEGATIVE
Comment: NEGATIVE
Comment: NEGATIVE
Comment: NEGATIVE
Comment: NORMAL
Neisseria Gonorrhea: NEGATIVE
Trichomonas: NEGATIVE

## 2019-09-08 MED ORDER — METRONIDAZOLE 500 MG PO TABS: 500.0000 mg | ORAL_TABLET | Freq: Three times a day (TID) | ORAL | 0 refills | Status: AC

## 2020-01-13 ENCOUNTER — Other Ambulatory Visit: Payer: Self-pay

## 2020-01-13 ENCOUNTER — Encounter: Payer: Self-pay | Admitting: Nurse Practitioner

## 2020-01-13 ENCOUNTER — Ambulatory Visit (INDEPENDENT_AMBULATORY_CARE_PROVIDER_SITE_OTHER): Payer: No Typology Code available for payment source | Admitting: Nurse Practitioner

## 2020-01-13 VITALS — BP 122/76 | HR 88 | Temp 98.6°F | Ht 65.5 in | Wt 183.0 lb

## 2020-01-13 DIAGNOSIS — E559 Vitamin D deficiency, unspecified: Secondary | ICD-10-CM | POA: Diagnosis not present

## 2020-01-13 DIAGNOSIS — J302 Other seasonal allergic rhinitis: Secondary | ICD-10-CM

## 2020-01-13 DIAGNOSIS — R5383 Other fatigue: Secondary | ICD-10-CM

## 2020-01-13 DIAGNOSIS — F419 Anxiety disorder, unspecified: Secondary | ICD-10-CM

## 2020-01-13 DIAGNOSIS — Z566 Other physical and mental strain related to work: Secondary | ICD-10-CM

## 2020-01-13 MED ORDER — LORATADINE 10 MG PO TABS: 10.0000 mg | ORAL_TABLET | Freq: Every day | ORAL | 1 refills | Status: DC

## 2020-01-13 MED ORDER — VITAMIN D (ERGOCALCIFEROL) 1.25 MG (50000 UNIT) PO CAPS: 50000.0000 [IU] | ORAL_CAPSULE | ORAL | 1 refills | Status: DC

## 2020-01-13 NOTE — Progress Notes (Signed)
This visit occurred during the SARS-CoV-2 public health emergency.  Safety protocols were in place, including screening questions prior to the visit, additional usage of staff PPE, and extensive cleaning of exam room while observing appropriate contact time as indicated for disinfecting solutions.  Subjective:     Patient ID: Kathryn Moyer , female    DOB: 09/12/1985 , 34 y.o.   MRN: [id number]   Chief Complaint  Patient presents with  . Referral    patient stated she needs a referral to mental health.     HPI  Increase stress at work causing her anxiety. She would like FMLA. She is having role strain has an autistic child. She would like a referral to  She drinks 2 shots of Hennessey on her days off - describes as not being like her.  She has not taken any medications. She went to work on Saturday and has not been back since Saturday.  She called out of work on Sunday. She works 6-7 days a week.    Anxiety Presents for follow-up visit. Symptoms include insomnia. Patient reports no chest pain, dizziness or palpitations. The quality of sleep is poor.    Insomnia Primary symptoms: sleep disturbance.  The current episode started more than one month. The onset quality is gradual. The problem occurs intermittently. The symptoms are aggravated by anxiety, work stress and emotional upset. Past treatments include alcohol. PMH includes: associated symptoms present, family stress or anxiety, work related stressors.     Past Medical History:  Diagnosis Date  . Hx of varicella   . Medical history non-contributory      Family History  Problem Relation Age of Onset  . Hearing loss Paternal Grandmother        deaf  . Hearing loss Paternal Grandfather        deaf  . Diabetes Paternal Grandfather   . Hypertension Mother   . Hypertension Father   . Cancer Maternal Grandmother        lung  . Diabetes Maternal Grandfather   . Autism Son      Current Outpatient Medications:  .   loratadine (CLARITIN) 10 MG tablet, Take 1 tablet (10 mg total) by mouth daily. (Patient not taking: Reported on 01/13/2020), Disp: 30 tablet, Rfl: 2 .  Vitamin D, Ergocalciferol, (DRISDOL) 50000 units CAPS capsule, Take 1 capsule (50,000 Units total) by mouth every 7 (seven) days. (Patient not taking: Reported on 09/04/2019), Disp: 24 capsule, Rfl: 1   Allergies  Allergen Reactions  . Other Hives and Rash    *foods with high acidity*     Review of Systems  Constitutional: Negative.  Negative for fatigue.  Respiratory: Negative.  Negative for cough.   Cardiovascular: Negative for chest pain, palpitations and leg swelling.  Endocrine: Negative for polydipsia, polyphagia and polyuria.  Neurological: Negative for dizziness and headaches.  Psychiatric/Behavioral: Positive for sleep disturbance. Negative for agitation. The patient has insomnia.      Today's Vitals   01/13/20 1551  BP: 122/76  Pulse: 88  Temp: 98.6 F (37 C)  TempSrc: Oral  Weight: 183 lb (83 kg)  Height: 5' 5.5" (1.664 m)  PainSc: 0-No pain   Body mass index is 29.99 kg/m.   Objective:  Physical Exam Constitutional:      General: She is not in acute distress.    Appearance: Normal appearance.  Cardiovascular:     Rate and Rhythm: Normal rate and regular rhythm.     Pulses: Normal pulses.  Heart sounds: Normal heart sounds. No murmur.  Pulmonary:     Effort: Pulmonary effort is normal. No respiratory distress.     Breath sounds: Normal breath sounds.  Neurological:     General: No focal deficit present.     Mental Status: She is alert and oriented to person, place, and time.     Cranial Nerves: No cranial nerve deficit.  Psychiatric:        Mood and Affect: Mood normal.        Behavior: Behavior normal.        Thought Content: Thought content normal.        Judgment: Judgment normal.         Assessment And Plan:     1. Anxiety  Worsened over the last few months where it is affecting her at  work  Will refer for counseling - Ambulatory referral to Psychology  2. Vitamin D deficiency  Will check vitamin D level and supplement as needed.     Also encouraged to spend 15 minutes in the sun daily.  - Vitamin D (25 hydroxy)  3. Seasonal allergies  Refill for claritin sent  - loratadine (CLARITIN) 10 MG tablet; Take 1 tablet (10 mg total) by mouth daily.  Dispense: 90 tablet; Refill: 1  4. Fatigue, unspecified type  Will check for metabolic cause vs related to her stress - Vitamin D, Ergocalciferol, (DRISDOL) 1.25 MG (50000 UNIT) CAPS capsule; Take 1 capsule (50,000 Units total) by mouth 2 (two) times a week.  Dispense: 24 capsule; Refill: 1  5. Work-related stress  Will refer for counseling   She is out of work for 2 weeks - Ambulatory referral to Psychology   Minette Brine, FNP    THE PATIENT IS ENCOURAGED TO PRACTICE SOCIAL DISTANCING DUE TO THE COVID-19 PANDEMIC.

## 2020-01-14 LAB — VITAMIN D 25 HYDROXY (VIT D DEFICIENCY, FRACTURES): Vit D, 25-Hydroxy: 15.1 ng/mL — ABNORMAL LOW (ref 30.0–100.0)

## 2020-01-26 ENCOUNTER — Telehealth: Payer: Self-pay

## 2020-01-26 ENCOUNTER — Encounter: Payer: Self-pay | Admitting: Nurse Practitioner

## 2020-01-26 NOTE — Telephone Encounter (Signed)
Patient called stating she doesn't have any appointment with the physiatrist yet and she is supposed to go back to work today. I returned her call and advised her that her work letter has been sent to her mychart and also provided her with Dr.Akintayo's office number for an appointment. YL,RMA

## 2020-01-29 ENCOUNTER — Encounter: Payer: Self-pay | Admitting: Nurse Practitioner

## 2020-02-11 ENCOUNTER — Encounter: Payer: Self-pay | Admitting: Nurse Practitioner

## 2020-02-24 ENCOUNTER — Other Ambulatory Visit: Payer: Self-pay

## 2020-02-24 ENCOUNTER — Ambulatory Visit (INDEPENDENT_AMBULATORY_CARE_PROVIDER_SITE_OTHER): Payer: No Typology Code available for payment source | Admitting: Nurse Practitioner

## 2020-02-24 VITALS — BP 118/66 | Temp 98.2°F | Ht 65.5 in | Wt 193.0 lb

## 2020-02-24 DIAGNOSIS — F419 Anxiety disorder, unspecified: Secondary | ICD-10-CM | POA: Diagnosis not present

## 2020-02-24 NOTE — Patient Instructions (Signed)
COVID-19 Vaccine Information can be found at: [email or web address] For questions related to vaccine distribution or appointments, please email [email or web address] or call [phone].    

## 2020-02-24 NOTE — Progress Notes (Signed)
This visit occurred during the SARS-CoV-2 public health emergency.  Safety protocols were in place, including screening questions prior to the visit, additional usage of staff PPE, and extensive cleaning of exam room while observing appropriate contact time as indicated for disinfecting solutions.  Subjective:     Patient ID: Kathryn Moyer , female    DOB: 1986-06-30 , 34 y.o.   MRN: [id number]   Chief Complaint  Patient presents with  . Anxiety    f/u    HPI  She has been approved at her job until the 28th of July.  She is taking fluoxetine and feels better, wants to do things.  She has been started on fluoxetine and is doing well.   Anxiety Presents for follow-up visit. Symptoms include nervous/anxious behavior (not as significant this visit). Patient reports no chest pain or palpitations.       Past Medical History:  Diagnosis Date  . Hx of varicella   . Medical history non-contributory      Family History  Problem Relation Age of Onset  . Hearing loss Paternal Grandmother        deaf  . Hearing loss Paternal Grandfather        deaf  . Diabetes Paternal Grandfather   . Hypertension Mother   . Hypertension Father   . Cancer Maternal Grandmother        lung  . Diabetes Maternal Grandfather   . Autism Son      Current Outpatient Medications:  .  FLUoxetine (PROZAC) 20 MG tablet, Take 20 mg by mouth daily., Disp: , Rfl:  .  loratadine (CLARITIN) 10 MG tablet, Take 1 tablet (10 mg total) by mouth daily., Disp: 90 tablet, Rfl: 1 .  Vitamin D, Ergocalciferol, (DRISDOL) 1.25 MG (50000 UNIT) CAPS capsule, Take 1 capsule (50,000 Units total) by mouth 2 (two) times a week., Disp: 24 capsule, Rfl: 1   Allergies  Allergen Reactions  . Other Hives and Rash    *foods with high acidity*     Review of Systems  Constitutional: Negative.   Respiratory: Negative.   Cardiovascular: Negative.  Negative for chest pain, palpitations and leg swelling.  Neurological: Negative.    Psychiatric/Behavioral: The patient is nervous/anxious (not as significant this visit).      Today's Vitals   02/24/20 1651  BP: 118/66  Temp: 98.2 F (36.8 C)  TempSrc: Oral  Weight: 193 lb (87.5 kg)  Height: 5' 5.5" (1.664 m)  PainSc: 0-No pain   Body mass index is 31.63 kg/m.   Objective:  Physical Exam Vitals reviewed.  Constitutional:      General: She is not in acute distress.    Appearance: Normal appearance.  Pulmonary:     Effort: Pulmonary effort is normal. No respiratory distress.  Neurological:     General: No focal deficit present.     Mental Status: She is alert and oriented to person, place, and time.     Cranial Nerves: No cranial nerve deficit.  Psychiatric:        Mood and Affect: Mood normal.        Behavior: Behavior normal.        Thought Content: Thought content normal.        Judgment: Judgment normal.         Assessment And Plan:     1. Anxiety Comments: She is feeling better She is now being followed by psychiatrist/therapist, tolerating Prozac well I did make her aware to not discontinue medication  abruptly     Patient was given opportunity to ask questions. Patient verbalized understanding of the plan and was able to repeat key elements of the plan. All questions were answered to their satisfaction.  Arnette Felts, FNP   I, Arnette Felts, FNP, have reviewed all documentation for this visit. The documentation on 02/29/20 for the exam, diagnosis, procedures, and orders are all accurate and complete.  THE PATIENT IS ENCOURAGED TO PRACTICE SOCIAL DISTANCING DUE TO THE COVID-19 PANDEMIC.

## 2020-02-29 ENCOUNTER — Encounter: Payer: Self-pay | Admitting: Nurse Practitioner

## 2020-05-26 ENCOUNTER — Encounter: Payer: No Typology Code available for payment source | Admitting: Nurse Practitioner

## 2020-06-28 ENCOUNTER — Encounter: Payer: No Typology Code available for payment source | Admitting: Nurse Practitioner

## 2020-06-30 ENCOUNTER — Ambulatory Visit (INDEPENDENT_AMBULATORY_CARE_PROVIDER_SITE_OTHER): Payer: No Typology Code available for payment source | Admitting: Nurse Practitioner

## 2020-06-30 ENCOUNTER — Encounter: Payer: Self-pay | Admitting: Nurse Practitioner

## 2020-06-30 ENCOUNTER — Other Ambulatory Visit: Payer: Self-pay

## 2020-06-30 VITALS — BP 110/62 | HR 83 | Temp 98.2°F | Ht 66.0 in | Wt 194.0 lb

## 2020-06-30 DIAGNOSIS — Z0000 Encounter for general adult medical examination without abnormal findings: Secondary | ICD-10-CM

## 2020-06-30 DIAGNOSIS — Z2821 Immunization not carried out because of patient refusal: Secondary | ICD-10-CM

## 2020-06-30 DIAGNOSIS — E559 Vitamin D deficiency, unspecified: Secondary | ICD-10-CM | POA: Diagnosis not present

## 2020-06-30 DIAGNOSIS — Z1159 Encounter for screening for other viral diseases: Secondary | ICD-10-CM

## 2020-06-30 DIAGNOSIS — E6609 Other obesity due to excess calories: Secondary | ICD-10-CM

## 2020-06-30 DIAGNOSIS — Z6831 Body mass index (BMI) 31.0-31.9, adult: Secondary | ICD-10-CM

## 2020-06-30 NOTE — Progress Notes (Signed)
I,Tianna Badgett,acting as a Education administrator for [employer], NP.,have documented all relevant documentation on the behalf of [employer], NP,as directed by  Bary Castilla, NP while in the presence of Bary Castilla, NP.  This visit occurred during the SARS-CoV-2 public health emergency.  Safety protocols were in place, including screening questions prior to the visit, additional usage of staff PPE, and extensive cleaning of exam room while observing appropriate contact time as indicated for disinfecting solutions.  Subjective:     Patient ID: Kathryn Moyer , female    DOB: December 04, 1985 , 34 y.o.   MRN: [id number]   Chief Complaint  Patient presents with  . Annual Exam    HPI  Patient is here today for physical exam. She is seen at [facility]. She stated that she gets boils often and she would like to know if there is something that she can do to prevent them. She will make an appointment next time she has a boil so we can test her for MRSA.  Patient will also make an appointment with there dermatologist.  She will make an appt with the OBGYN to get her PAP.   She just lost her brother in September. She was seeing therapy/counseling in the past and stated she will make a appointment again to see them.   She exercises at work 10,000 steps a day. She tries to eat healthy and tries to airfry her food.   Wt Readings from Last 3 Encounters: 06/30/20 : 194 lb (88 kg) 02/24/20 : 193 lb (87.5 kg) 01/13/20 : 183 lb (83 kg)     Past Medical History:  Diagnosis Date  . Hx of varicella   . Medical history non-contributory      Family History  Problem Relation Age of Onset  . Hearing loss Paternal Grandmother        deaf  . Hearing loss Paternal Grandfather        deaf  . Diabetes Paternal Grandfather   . Hypertension Mother   . Hypertension Father   . Cancer Maternal Grandmother        lung  . Diabetes Maternal Grandfather   . Autism Son      Current Outpatient  Medications:  .  FLUoxetine (PROZAC) 20 MG tablet, Take 20 mg by mouth daily., Disp: , Rfl:  .  loratadine (CLARITIN) 10 MG tablet, Take 1 tablet (10 mg total) by mouth daily., Disp: 90 tablet, Rfl: 1 .  Vitamin D, Ergocalciferol, (DRISDOL) 1.25 MG (50000 UNIT) CAPS capsule, Take 1 capsule (50,000 Units total) by mouth 2 (two) times a week., Disp: 24 capsule, Rfl: 1   Allergies  Allergen Reactions  . Other Hives and Rash    *foods with high acidity*      The patient states she does not use any form of birth control.  Patient's last menstrual period was 06/13/2020. Negative for: breast discharge, breast lump(s), breast pain and breast self exam. Associated symptoms include abnormal vaginal bleeding. Pertinent negatives include abnormal bleeding (hematology), anxiety, decreased libido, depression, difficulty falling sleep, dyspareunia, history of infertility, nocturia, sexual dysfunction, sleep disturbances, urinary incontinence, urinary urgency, vaginal discharge and vaginal itching. Diet regular.The patient states her exercise level is    . The patient's tobacco use is:  She smokes occasionally couple of cigarettes a day.  She drinks on the weekend.     Social History   Tobacco Use  Smoking Status Former Smoker  . Packs/day: 0.00  . Years: 4.00  . Pack  years: 0.00  . Types: Cigarettes  . Quit date: 08/31/2013  . Years since quitting: 6.8  Smokeless Tobacco Never Used  . She has been exposed to passive smoke. The patient's alcohol use is:  Social History   Substance and Sexual Activity  Alcohol Use No  . Additional information: Last pap unknown, next one scheduled (she will make an appt with her OBGYN to schedule one.    Review of Systems  Constitutional: Negative.  Negative for appetite change, fatigue and fever.  HENT: Negative.  Negative for sinus pain.   Eyes: Negative.   Respiratory: Negative.  Negative for chest tightness, shortness of breath and wheezing.    Cardiovascular: Negative.  Negative for chest pain and palpitations.  Gastrointestinal: Negative.  Negative for constipation, diarrhea and nausea.  Endocrine: Negative for polydipsia, polyphagia and polyuria.  Genitourinary: Negative for difficulty urinating.  Musculoskeletal: Negative.   Skin: Positive for rash. Negative for color change.  Allergic/Immunologic: Negative.   Neurological: Negative.  Negative for dizziness and headaches.  Hematological: Negative.   Psychiatric/Behavioral: Negative.      Today's Vitals   06/30/20 1046  BP: 110/62  Pulse: 83  Temp: 98.2 F (36.8 C)  TempSrc: Oral  Weight: 194 lb (88 kg)  Height: 5' 6"  (1.676 m)   Body mass index is 31.31 kg/m.  Wt Readings from Last 3 Encounters:  06/30/20 194 lb (88 kg)  02/24/20 193 lb (87.5 kg)  01/13/20 183 lb (83 kg)     Objective:  Physical Exam Constitutional:      Appearance: Normal appearance.  HENT:     Head: Normocephalic.     Right Ear: Tympanic membrane normal.     Left Ear: Tympanic membrane normal.     Nose:     Comments: Deferred masked     Mouth/Throat:     Comments: Deferred masked  Eyes:     Extraocular Movements: Extraocular movements intact.     Conjunctiva/sclera: Conjunctivae normal.     Pupils: Pupils are equal, round, and reactive to light.  Cardiovascular:     Rate and Rhythm: Normal rate and regular rhythm.     Pulses: Normal pulses.     Heart sounds: Normal heart sounds.  Pulmonary:     Effort: Pulmonary effort is normal. No respiratory distress.     Breath sounds: Normal breath sounds. No wheezing.  Chest:     Breasts:        Right: Normal. No tenderness.        Left: Normal. No tenderness.  Abdominal:     General: Bowel sounds are normal.     Palpations: Abdomen is soft.     Tenderness: There is no abdominal tenderness. There is no guarding.  Genitourinary:    Comments: Deferred patient will make an appt with OBGYN  Musculoskeletal:        General: Normal  range of motion.     Cervical back: Normal range of motion.  Skin:    General: Skin is warm and dry.     Findings: Rash present.     Comments: Patient has rash on her inner thighs that has healed.   Neurological:     Mental Status: She is alert and oriented to person, place, and time.     Motor: No weakness.     Gait: Gait normal.  Psychiatric:        Mood and Affect: Mood normal.        Behavior: Behavior normal.  Thought Content: Thought content normal.        Judgment: Judgment normal.         Assessment And Plan:     1. Encounter for annual physical exam A full exam was preferred.  The Geni/uro/anorectal was deferred as the patient wanted to make an appt with her OBGYN for that. Importance of monthly breast exam with the patient was discussed. Previous labs were reviewed.  Patient was advised to get 30-45 min of exercise for 4-5 days of week. Patient advised to follow a healthy diet with fruits/veggies incorporated in the meal. Increase intake of fish and decrease intake of red meat, fried foods.  Increase intake of baked or broiled foods. Avoid fast food and increase water intake.  - CBC - Hemoglobin A1c - CMP14+EGFR - Lipid panel  2. Vitamin D deficiency - VITAMIN D 25 Hydroxy (Vit-D Deficiency, Fractures) -Patient has history of Vit D deficiency  -Will refill Vit D after reviewing labs.   3. Class 1 obesity due to excess calories without serious comorbidity with body mass index (BMI) of 31.0 to 31.9 in adult -Educated patient on healthy eating habits that included avoiding carbs and saturated diets.  -Increase intake of water  -Increase exercise to at least 4-5 a week for at least 30 min.  -Avoid red meats, increase intake of fish, eat more baked or broiled fruits and vegetables.   4. Influenza vaccination declined -Patient stated she will make an appointment for flu shot however declined at this time.   5. COVID-19 vaccination declined -Patient stated she will  make appointment for the vaccine. -Advised her to make an appt with our office as we offer Covid Vaccine every other Friday  6. Encounter for hepatitis C screening test for low risk patient - Hepatitis C antibody -Will review labs with patient once they are back.   Patient stated she will make an appointment with her OBGYN for her Pap. Patient stated she will also contact her counselor because her brother passed away in May 14, 2023. She will also contact a dermatologist about her rash that she continues to have.   She is encouraged to strive for BMI less than 30 to decrease cardiac risk. Advised to aim for at least 150 minutes of exercise per week.      Patient was given opportunity to ask questions. Patient verbalized understanding of the plan and was able to repeat key elements of the plan. All questions were answered to their satisfaction.   Bary Castilla, NP   I, Bary Castilla, NP, have reviewed all documentation for this visit. The documentation on 06/30/20 for the exam, diagnosis, procedures, and orders are all accurate and complete.  THE PATIENT IS ENCOURAGED TO PRACTICE SOCIAL DISTANCING DUE TO THE COVID-19 PANDEMIC.

## 2020-06-30 NOTE — Patient Instructions (Signed)
Health Maintenance, Female Adopting a healthy lifestyle and getting preventive care are important in promoting health and wellness. Ask your health care provider about:  The right schedule for you to have regular tests and exams.  Things you can do on your own to prevent diseases and keep yourself healthy. What should I know about diet, weight, and exercise? Eat a healthy diet   Eat a diet that includes plenty of vegetables, fruits, low-fat dairy products, and lean protein.  Do not eat a lot of foods that are high in solid fats, added sugars, or sodium. Maintain a healthy weight Body mass index (BMI) is used to identify weight problems. It estimates body fat based on height and weight. Your health care provider can help determine your BMI and help you achieve or maintain a healthy weight. Get regular exercise Get regular exercise. This is one of the most important things you can do for your health. Most adults should:  Exercise for at least 150 minutes each week. The exercise should increase your heart rate and make you sweat (moderate-intensity exercise).  Do strengthening exercises at least twice a week. This is in addition to the moderate-intensity exercise.  Spend less time sitting. Even light physical activity can be beneficial. Watch cholesterol and blood lipids Have your blood tested for lipids and cholesterol at 36 years of age, then have this test every 5 years. Have your cholesterol levels checked more often if:  Your lipid or cholesterol levels are high.  You are older than 36 years of age.  You are at high risk for heart disease. What should I know about cancer screening? Depending on your health history and family history, you may need to have cancer screening at various ages. This may include screening for:  Breast cancer.  Cervical cancer.  Colorectal cancer.  Skin cancer.  Lung cancer. What should I know about heart disease, diabetes, and high blood  pressure? Blood pressure and heart disease  High blood pressure causes heart disease and increases the risk of stroke. This is more likely to develop in people who have high blood pressure readings, are of African descent, or are overweight.  Have your blood pressure checked: ? Every 3-5 years if you are 18-39 years of age. ? Every year if you are 40 years old or older. Diabetes Have regular diabetes screenings. This checks your fasting blood sugar level. Have the screening done:  Once every three years after age 40 if you are at a normal weight and have a low risk for diabetes.  More often and at a younger age if you are overweight or have a high risk for diabetes. What should I know about preventing infection? Hepatitis B If you have a higher risk for hepatitis B, you should be screened for this virus. Talk with your health care provider to find out if you are at risk for hepatitis B infection. Hepatitis C Testing is recommended for:  Everyone born from 1945 through 1965.  Anyone with known risk factors for hepatitis C. Sexually transmitted infections (STIs)  Get screened for STIs, including gonorrhea and chlamydia, if: ? You are sexually active and are younger than 36 years of age. ? You are older than 36 years of age and your health care provider tells you that you are at risk for this type of infection. ? Your sexual activity has changed since you were last screened, and you are at increased risk for chlamydia or gonorrhea. Ask your health care provider if   you are at risk.  Ask your health care provider about whether you are at high risk for HIV. Your health care provider may recommend a prescription medicine to help prevent HIV infection. If you choose to take medicine to prevent HIV, you should first get tested for HIV. You should then be tested every 3 months for as long as you are taking the medicine. Pregnancy  If you are about to stop having your period (premenopausal) and  you may become pregnant, seek counseling before you get pregnant.  Take 400 to 800 micrograms (mcg) of folic acid every day if you become pregnant.  Ask for birth control (contraception) if you want to prevent pregnancy. Osteoporosis and menopause Osteoporosis is a disease in which the bones lose minerals and strength with aging. This can result in bone fractures. If you are 65 years old or older, or if you are at risk for osteoporosis and fractures, ask your health care provider if you should:  Be screened for bone loss.  Take a calcium or vitamin D supplement to lower your risk of fractures.  Be given hormone replacement therapy (HRT) to treat symptoms of menopause. Follow these instructions at home: Lifestyle  Do not use any products that contain nicotine or tobacco, such as cigarettes, e-cigarettes, and chewing tobacco. If you need help quitting, ask your health care provider.  Do not use street drugs.  Do not share needles.  Ask your health care provider for help if you need support or information about quitting drugs. Alcohol use  Do not drink alcohol if: ? Your health care provider tells you not to drink. ? You are pregnant, may be pregnant, or are planning to become pregnant.  If you drink alcohol: ? Limit how much you use to 0-1 drink a day. ? Limit intake if you are breastfeeding.  Be aware of how much alcohol is in your drink. In the [place] one drink equals one 12 oz bottle of beer (355 mL), one 5 oz glass of wine (148 mL), or one 1 oz glass of hard liquor (44 mL). General instructions  Schedule regular health, dental, and eye exams.  Stay current with your vaccines.  Tell your health care provider if: ? You often feel depressed. ? You have ever been abused or do not feel safe at home. Summary  Adopting a healthy lifestyle and getting preventive care are important in promoting health and wellness.  Follow your health care provider's instructions about healthy  diet, exercising, and getting tested or screened for diseases.  Follow your health care provider's instructions on monitoring your cholesterol and blood pressure. This information is not intended to replace advice given to you by your health care provider. Make sure you discuss any questions you have with your health care provider. Document Revised: 07/17/2018 Document Reviewed: 07/17/2018 [employer] Patient Education  2020 [employer]  

## 2020-07-01 LAB — CMP14+EGFR
ALT: 14 IU/L (ref 0–32)
AST: 13 IU/L (ref 0–40)
Albumin/Globulin Ratio: 1.8 (ref 1.2–2.2)
Albumin: 4.4 g/dL (ref 3.8–4.8)
Alkaline Phosphatase: 52 IU/L (ref 44–121)
BUN/Creatinine Ratio: 12 (ref 9–23)
BUN: 11 mg/dL (ref 6–20)
Bilirubin Total: 0.5 mg/dL (ref 0.0–1.2)
CO2: 26 mmol/L (ref 20–29)
Calcium: 9.3 mg/dL (ref 8.7–10.2)
Chloride: 105 mmol/L (ref 96–106)
Creatinine, Ser: 0.92 mg/dL (ref 0.57–1.00)
GFR calc Af Amer: 93 mL/min/{1.73_m2} (ref >59)
GFR calc non Af Amer: 80 mL/min/{1.73_m2} (ref >59)
Globulin, Total: 2.4 g/dL (ref 1.5–4.5)
Glucose: 80 mg/dL (ref 65–99)
Potassium: 4.4 mmol/L (ref 3.5–5.2)
Sodium: 142 mmol/L (ref 134–144)
Total Protein: 6.8 g/dL (ref 6.0–8.5)

## 2020-07-01 LAB — CBC
Hematocrit: 36.2 % (ref 34.0–46.6)
Hemoglobin: 11.9 g/dL (ref 11.1–15.9)
MCH: 31.6 pg (ref 26.6–33.0)
MCHC: 32.9 g/dL (ref 31.5–35.7)
MCV: 96 fL (ref 79–97)
Platelets: 424 10*3/uL (ref 150–450)
RBC: 3.76 x10E6/uL — ABNORMAL LOW (ref 3.77–5.28)
RDW: 13.4 % (ref 11.7–15.4)
WBC: 8 10*3/uL (ref 3.4–10.8)

## 2020-07-01 LAB — VITAMIN D 25 HYDROXY (VIT D DEFICIENCY, FRACTURES): Vit D, 25-Hydroxy: 26.3 ng/mL — ABNORMAL LOW (ref 30.0–100.0)

## 2020-07-01 LAB — LIPID PANEL
Chol/HDL Ratio: 3.1 ratio (ref 0.0–4.4)
Cholesterol, Total: 176 mg/dL (ref 100–199)
HDL: 56 mg/dL (ref >39)
LDL Chol Calc (NIH): 106 mg/dL — ABNORMAL HIGH (ref 0–99)
Triglycerides: 75 mg/dL (ref 0–149)
VLDL Cholesterol Cal: 14 mg/dL (ref 5–40)

## 2020-07-01 LAB — HEPATITIS C ANTIBODY: Hep C Virus Ab: <0.1 s/co ratio (ref 0.0–0.9)

## 2020-07-01 LAB — HEMOGLOBIN A1C
Est. average glucose Bld gHb Est-mCnc: 111 mg/dL
Hgb A1c MFr Bld: 5.5 % (ref 4.8–5.6)

## 2021-06-17 ENCOUNTER — Encounter: Payer: Self-pay | Admitting: Nurse Practitioner

## 2021-07-05 ENCOUNTER — Encounter: Payer: No Typology Code available for payment source | Admitting: Nurse Practitioner

## 2022-01-17 LAB — OB RESULTS CONSOLE RUBELLA ANTIBODY, IGM: Rubella: IMMUNE

## 2022-01-17 LAB — OB RESULTS CONSOLE ABO/RH: RH Type: POSITIVE

## 2022-01-17 LAB — OB RESULTS CONSOLE HEPATITIS B SURFACE ANTIGEN: Hepatitis B Surface Ag: NEGATIVE

## 2022-01-17 LAB — OB RESULTS CONSOLE HIV ANTIBODY (ROUTINE TESTING): HIV: NONREACTIVE

## 2022-01-17 LAB — OB RESULTS CONSOLE GC/CHLAMYDIA
Chlamydia: NEGATIVE
Neisseria Gonorrhea: NEGATIVE

## 2022-01-17 LAB — HEPATITIS C ANTIBODY: HCV Ab: NEGATIVE

## 2022-01-17 LAB — OB RESULTS CONSOLE RPR: RPR: NONREACTIVE

## 2022-01-17 LAB — OB RESULTS CONSOLE ANTIBODY SCREEN: Antibody Screen: NEGATIVE

## 2022-05-24 ENCOUNTER — Ambulatory Visit (HOSPITAL_COMMUNITY)
Admission: EM | Admit: 2022-05-24 | Discharge: 2022-05-24 | Disposition: A | Discharge status: Home / Self Care | Payer: No Typology Code available for payment source | Attending: Emergency Medicine | Admitting: Emergency Medicine

## 2022-05-24 ENCOUNTER — Encounter (HOSPITAL_COMMUNITY): Payer: Self-pay | Admitting: Emergency Medicine

## 2022-05-24 DIAGNOSIS — H66001 Acute suppurative otitis media without spontaneous rupture of ear drum, right ear: Secondary | ICD-10-CM | POA: Insufficient documentation

## 2022-05-24 DIAGNOSIS — Z792 Long term (current) use of antibiotics: Secondary | ICD-10-CM | POA: Diagnosis not present

## 2022-05-24 DIAGNOSIS — Z1152 Encounter for screening for COVID-19: Secondary | ICD-10-CM | POA: Diagnosis not present

## 2022-05-24 DIAGNOSIS — J988 Other specified respiratory disorders: Secondary | ICD-10-CM | POA: Diagnosis present

## 2022-05-24 LAB — RESP PANEL BY RT-PCR (FLU A&B, COVID) ARPGX2
Influenza A by PCR: NEGATIVE
Influenza B by PCR: NEGATIVE
SARS Coronavirus 2 by RT PCR: NEGATIVE

## 2022-05-24 MED ORDER — CEFDINIR 300 MG PO CAPS: 300.0000 mg | ORAL_CAPSULE | Freq: Two times a day (BID) | ORAL | 0 refills | Status: AC

## 2022-05-24 NOTE — ED Provider Notes (Signed)
[place]    CSN: [id number] Arrival date & time: 05/24/22  1640    HISTORY   Chief Complaint  Patient presents with   Sore Throat   Otalgia   HPI Kathryn Moyer is a pleasant, 36 y.o. female who presents to urgent care today. Patient complains of right ear pain as well as pain in the right side of her throat that began yesterday.  Patient states she is 7 months pregnant and has not tried any medications to alleviate her symptoms.  Patient reports a history of frequent infections in her right ear in the past.  Patient denies fever, aches, chills, nausea, vomiting, diarrhea, hearing loss, drainage from her right ear, nasal congestion, sinus pressure, cough.  Patient denies known sick contacts.  The history is provided by the patient.  Sore Throat  Otalgia  Past Medical History:  Diagnosis Date   Hx of varicella    Medical history non-contributory    Patient Active Problem List   Diagnosis Date Noted   Allergic reaction 08/08/2018   Right hand pain 08/08/2018   Vitamin D deficiency 07/09/2018   Fatigue 07/09/2018   S/P repeat low transverse C-section 09/07/2017   S/P C-section 04/02/2014   Normal pregnancy 03/31/2014   Past Surgical History:  Procedure Laterality Date   ANTERIOR CRUCIATE LIGAMENT REPAIR     R klnee   CESAREAN SECTION N/A 04/02/2014   Procedure: CESAREAN SECTION;  Surgeon: Logan Bores, MD;  Location: [facility];  Service: Obstetrics;  Laterality: N/A;   CESAREAN SECTION N/A 09/07/2017   Procedure: REPEAT CESAREAN SECTION;  Surgeon: Paula Compton, MD;  Location: [facility];  Service: Obstetrics;  Laterality: N/A;  Tracey RNFA   OB History     Gravida  3   Para  2   Term  2   Preterm      AB      Living  2      SAB      IAB      Ectopic      Multiple  0   Live Births  2          Home Medications    Prior to Admission medications   Not on File    Family History Family History  Problem Relation  Age of Onset   Hearing loss Paternal 94        deaf   Hearing loss Paternal Grandfather        deaf   Diabetes Paternal Grandfather    Hypertension Mother    Hypertension Father    Cancer Maternal Grandmother        lung   Diabetes Maternal Grandfather    Autism Son    Social History Social History   Tobacco Use   Smoking status: Every Day    Packs/day: 0.00    Years: 4.00    Total pack years: 0.00    Types: Cigarettes    Last attempt to quit: 08/31/2013    Years since quitting: 8.7   Smokeless tobacco: Never  Substance Use Topics   Alcohol use: Yes    Comment: She drinks spirits on the weekend    Drug use: No   Allergies   Other  Review of Systems Review of Systems  HENT:  Positive for ear pain.    Pertinent findings revealed after performing a 14 point review of systems has been noted in the history of present illness.  Physical Exam Triage Vital Signs ED  Triage Vitals  Enc Vitals Group     BP 06/03/21 0827 (!) 147/82     Pulse Rate 06/03/21 0827 72     Resp 06/03/21 0827 18     Temp 06/03/21 0827 98.3 F (36.8 C)     Temp Source 06/03/21 0827 Oral     SpO2 06/03/21 0827 98 %     Weight --      Height --      Head Circumference --      Peak Flow --      Pain Score 06/03/21 0826 5     Pain Loc --      Pain Edu? --      Excl. in GC? --   No data found.  Updated Vital Signs BP 117/66 (BP Location: Left Arm)   Pulse 96   Temp 98.5 F (36.9 C) (Oral)   Resp 18   SpO2 97%   Physical Exam Vitals and nursing note reviewed.  Constitutional:      General: She is not in acute distress.    Appearance: Normal appearance. She is not ill-appearing.  HENT:     Head: Normocephalic and atraumatic.     Salivary Glands: Right salivary gland is not diffusely enlarged or tender. Left salivary gland is not diffusely enlarged or tender.     Right Ear: Hearing, ear canal and external ear normal. No drainage. A middle ear effusion is present. There is no  impacted cerumen. Tympanic membrane is injected, erythematous and bulging.     Left Ear: Hearing, tympanic membrane, ear canal and external ear normal. No drainage.  No middle ear effusion. There is no impacted cerumen. Tympanic membrane is not erythematous or bulging.     Nose: Rhinorrhea present. No nasal deformity, septal deviation, mucosal edema or congestion. Rhinorrhea is clear.     Right Turbinates: Not enlarged, swollen or pale.     Left Turbinates: Not enlarged, swollen or pale.     Right Sinus: No maxillary sinus tenderness or frontal sinus tenderness.     Left Sinus: No maxillary sinus tenderness or frontal sinus tenderness.     Mouth/Throat:     Lips: Pink. No lesions.     Mouth: Mucous membranes are moist. No oral lesions.     Tongue: No lesions. Tongue does not deviate from midline.     Palate: No mass and lesions.     Pharynx: Uvula midline. Pharyngeal swelling and posterior oropharyngeal erythema present. No oropharyngeal exudate or uvula swelling.     Tonsils: No tonsillar exudate or tonsillar abscesses. 1+ on the right. 0 on the left.  Eyes:     General: Lids are normal.        Right eye: No discharge.        Left eye: No discharge.     Extraocular Movements: Extraocular movements intact.     Conjunctiva/sclera: Conjunctivae normal.     Right eye: Right conjunctiva is not injected.     Left eye: Left conjunctiva is not injected.  Neck:     Trachea: Trachea and phonation normal.  Cardiovascular:     Rate and Rhythm: Normal rate and regular rhythm.     Pulses: Normal pulses.     Heart sounds: Normal heart sounds, S1 normal and S2 normal. Heart sounds not distant. No murmur heard.    No friction rub. No gallop. No S3 or S4 sounds.  Pulmonary:     Effort: Pulmonary effort is normal. No tachypnea, bradypnea, accessory  muscle usage, prolonged expiration, respiratory distress or retractions.     Breath sounds: Normal breath sounds. No stridor, decreased air movement or  transmitted upper airway sounds. No decreased breath sounds, wheezing, rhonchi or rales.  Chest:     Chest wall: No tenderness.  Musculoskeletal:        General: Normal range of motion.     Cervical back: Normal range of motion and neck supple. Normal range of motion.  Lymphadenopathy:     Cervical: Cervical adenopathy present.     Right cervical: Superficial cervical adenopathy present.     Left cervical: Superficial cervical adenopathy present.  Skin:    General: Skin is warm and dry.     Findings: No erythema or rash.  Neurological:     General: No focal deficit present.     Mental Status: She is alert and oriented to person, place, and time.  Psychiatric:        Mood and Affect: Mood normal.        Behavior: Behavior normal.     Visual Acuity Right Eye Distance:   Left Eye Distance:   Bilateral Distance:    Right Eye Near:   Left Eye Near:    Bilateral Near:     UC Couse / Diagnostics / Procedures:     Radiology No results found.  Procedures Procedures (including critical care time) EKG  Pending results:  Labs Reviewed  RESP PANEL BY RT-PCR (FLU A&B, COVID) [id number]    Medications Ordered in UC: Medications - No data to display  UC Diagnoses / Final Clinical Impressions(s)   I have reviewed the triage vital signs and the nursing notes.  Pertinent labs & imaging results that were available during my care of the patient were reviewed by me and considered in my medical decision making (see chart for details).    Final diagnoses:  Respiratory infection  Acute suppurative otitis media of right ear without spontaneous rupture of tympanic membrane, recurrence not specified   COVID-19 and influenza testing performed during visit today, results are pending.  Patient would benefit from antiviral treatment if she test positive for either 1 due to being 7 months pregnant.  Patient provided with cefdinir for acute bacterial infection in her right inner ear.  Patient  advised to follow-up if no better after 10 days of treatment, sooner if worsening despite treatment.  ED Prescriptions     Medication Sig Dispense Auth. Provider   cefdinir (OMNICEF) 300 MG capsule Take 1 capsule (300 mg total) by mouth 2 (two) times daily for 10 days. 20 capsule Theadora Rama Scales, PA-C      PDMP not reviewed this encounter.  Disposition Upon Discharge:  Condition: stable for discharge home Home: take medications as prescribed; routine discharge instructions as discussed; follow up as advised.  Patient presented with an acute illness with associated systemic symptoms and significant discomfort requiring urgent management. In my opinion, this is a condition that a prudent lay person (someone who possesses an average knowledge of health and medicine) may potentially expect to result in complications if not addressed urgently such as respiratory distress, impairment of bodily function or dysfunction of bodily organs.   Routine symptom specific, illness specific and/or disease specific instructions were discussed with the patient and/or caregiver at length.   As such, the patient has been evaluated and assessed, work-up was performed and treatment was provided in alignment with urgent care protocols and evidence based medicine.  Patient/parent/caregiver has been advised that the patient  may require follow up for further testing and treatment if the symptoms continue in spite of treatment, as clinically indicated and appropriate.  If the patient was tested for COVID-19, Influenza and/or RSV, then the patient/parent/guardian was advised to isolate at home pending the results of his/her diagnostic coronavirus test and potentially longer if they're positive. I have also advised pt that if his/her COVID-19 test returns positive, it's recommended to self-isolate for at least 10 days after symptoms first appeared AND until fever-free for 24 hours without fever reducer AND other  symptoms have improved or resolved. Discussed self-isolation recommendations as well as instructions for household member/close contacts as per the [facility] and [place] [facility], and also gave patient the COVID packet with this information.  Patient/parent/caregiver has been advised to return to the [facility] or PCP in 3-5 days if no better; to PCP or the Emergency Department if new signs and symptoms develop, or if the current signs or symptoms continue to change or worsen for further workup, evaluation and treatment as clinically indicated and appropriate  The patient will follow up with their current PCP if and as advised. If the patient does not currently have a PCP we will assist them in obtaining one.   The patient may need specialty follow up if the symptoms continue, in spite of conservative treatment and management, for further workup, evaluation, consultation and treatment as clinically indicated and appropriate.  Patient/parent/caregiver verbalized understanding and agreement of plan as discussed.  All questions were addressed during visit.  Please see discharge instructions below for further details of plan.  Discharge Instructions:   Discharge Instructions      The results of your viral testing will be posted to your MyChart later this evening.  If either result is positive, you will be contacted by phone with further instructions.  Because you are 7 months pregnant, antiviral treatment is indicated if either result is positive.  To treat the infection in your right ear, please begin cefdinir 1 capsule twice daily for the next 10 days.  If you have not had meaningful improvement of your pain and pressure in your right ear on the right side of your throat after completing antibiotics, please follow-up with your primary care provider or return to urgent care for further evaluation.  Please seek further evaluation sooner if your discomfort worsens or you begin to have persistent fever while taking  cefdinir.  Thank you for visiting urgent care today.        This office note has been dictated using Teaching laboratory technician.  Unfortunately, this method of dictation can sometimes lead to typographical or grammatical errors.  I apologize for your inconvenience in advance if this occurs.  Please do not hesitate to reach out to me if clarification is needed.      Theadora Rama Scales, PA-C 05/24/22 [phone]

## 2022-05-24 NOTE — ED Triage Notes (Signed)
Pt c/o right ear, head and right side of throat pain that started yesterday. Pt reports 7 months pregnant and limited on what can take.

## 2022-05-24 NOTE — Discharge Instructions (Addendum)
The results of your viral testing will be posted to your MyChart later this evening.  If either result is positive, you will be contacted by phone with further instructions.  Because you are 7 months pregnant, antiviral treatment is indicated if either result is positive.  To treat the infection in your right ear, please begin cefdinir 1 capsule twice daily for the next 10 days.  If you have not had meaningful improvement of your pain and pressure in your right ear on the right side of your throat after completing antibiotics, please follow-up with your primary care provider or return to urgent care for further evaluation.  Please seek further evaluation sooner if your discomfort worsens or you begin to have persistent fever while taking cefdinir.  Thank you for visiting urgent care today.

## 2022-07-25 ENCOUNTER — Encounter (HOSPITAL_COMMUNITY): Payer: Self-pay

## 2022-07-25 ENCOUNTER — Encounter (HOSPITAL_COMMUNITY): Payer: Self-pay | Admitting: *Deleted

## 2022-07-25 NOTE — Patient Instructions (Signed)
Kathryn Moyer  07/25/2022   Your procedure is scheduled on:  08/08/2022  Arrive at 1030 at Entrance C on [employer] at [facility]  and [employer]. You are invited to use the FREE valet parking or use the Visitor's parking deck.  Pick up the phone at the desk and dial [phone].  Call this number if you have problems the morning of surgery: [phone]  Remember:   Do not eat food:(After Midnight) Desps de medianoche.  Do not drink clear liquids: (After Midnight) Desps de medianoche.  Take these medicines the morning of surgery with A SIP OF WATER:  none   Do not wear jewelry, make-up or nail polish.  Do not wear lotions, powders, or perfumes. Do not wear deodorant.  Do not shave 48 hours prior to surgery.  Do not bring valuables to the hospital.  [facility] is not   responsible for any belongings or valuables brought to the hospital.  Contacts, dentures or bridgework may not be worn into surgery.  Leave suitcase in the car. After surgery it may be brought to your room.  For patients admitted to the hospital, checkout time is 11:00 AM the day of              discharge.      Please read over the following fact sheets that you were given:     Preparing for Surgery

## 2022-07-28 NOTE — Anesthesia Preprocedure Evaluation (Addendum)
Anesthesia Evaluation  Patient identified by MRN, date of birth, ID band Patient awake    Reviewed: Allergy & Precautions, NPO status , Patient's Chart, lab work & pertinent test results  Airway Mallampati: II  TM Distance: >3 FB Neck ROM: Full    Dental  (+) Teeth Intact, Dental Advisory Given   Pulmonary Current Smoker   Pulmonary exam normal breath sounds clear to auscultation       Cardiovascular negative cardio ROS Normal cardiovascular exam Rhythm:Regular Rate:Normal     Neuro/Psych negative neurological ROS  negative psych ROS   GI/Hepatic negative GI ROS, Neg liver ROS,,,  Endo/Other    Morbid obesityBMI 41  Renal/GU negative Renal ROS  negative genitourinary   Musculoskeletal negative musculoskeletal ROS (+)    Abdominal  (+) + obese  Peds  Hematology  (+) Blood dyscrasia, anemia Hb 10.5, plt 312   Anesthesia Other Findings   Reproductive/Obstetrics (+) Pregnancy 2 prior sections 2015, 2019                              Anesthesia Physical Anesthesia Plan  ASA: 3  Anesthesia Plan: Spinal   Post-op Pain Management: Regional block, Toradol IV (intra-op)* and Ofirmev IV (intra-op)*   Induction:   PONV Risk Score and Plan: 3 and Ondansetron, Dexamethasone and Treatment may vary due to age or medical condition  Airway Management Planned: Natural Airway and Nasal Cannula  Additional Equipment: None  Intra-op Plan:   Post-operative Plan:   Informed Consent: I have reviewed the patients History and Physical, chart, labs and discussed the procedure including the risks, benefits and alternatives for the proposed anesthesia with the patient or authorized representative who has indicated his/her understanding and acceptance.       Plan Discussed with: CRNA  Anesthesia Plan Comments:        Anesthesia Quick Evaluation

## 2022-08-05 ENCOUNTER — Inpatient Hospital Stay (HOSPITAL_COMMUNITY)
Admission: AD | Admit: 2022-08-05 | Discharge: 2022-08-05 | Disposition: A | Discharge status: Home / Self Care | Payer: No Typology Code available for payment source | Source: Home / Self Care | Attending: Obstetrics and Gynecology | Admitting: Obstetrics and Gynecology

## 2022-08-05 ENCOUNTER — Other Ambulatory Visit: Payer: Self-pay

## 2022-08-05 DIAGNOSIS — Z01818 Encounter for other preprocedural examination: Secondary | ICD-10-CM | POA: Insufficient documentation

## 2022-08-05 DIAGNOSIS — Z09 Encounter for follow-up examination after completed treatment for conditions other than malignant neoplasm: Secondary | ICD-10-CM

## 2022-08-05 DIAGNOSIS — Z3493 Encounter for supervision of normal pregnancy, unspecified, third trimester: Secondary | ICD-10-CM

## 2022-08-05 DIAGNOSIS — O34211 Maternal care for low transverse scar from previous cesarean delivery: Secondary | ICD-10-CM | POA: Diagnosis not present

## 2022-08-05 DIAGNOSIS — Z3A38 38 weeks gestation of pregnancy: Secondary | ICD-10-CM

## 2022-08-05 LAB — CBC
HCT: 31.5 % — ABNORMAL LOW (ref 36.0–46.0)
Hemoglobin: 10.5 g/dL — ABNORMAL LOW (ref 12.0–15.0)
MCH: 30.3 pg (ref 26.0–34.0)
MCHC: 33.3 g/dL (ref 30.0–36.0)
MCV: 90.8 fL (ref 80.0–100.0)
Platelets: 312 10*3/uL (ref 150–400)
RBC: 3.47 MIL/uL — ABNORMAL LOW (ref 3.87–5.11)
RDW: 14.5 % (ref 11.5–15.5)
WBC: 10.7 10*3/uL — ABNORMAL HIGH (ref 4.0–10.5)
nRBC: 0.3 % — ABNORMAL HIGH (ref 0.0–0.2)

## 2022-08-05 LAB — TYPE AND SCREEN
ABO/RH(D): O POS
Antibody Screen: NEGATIVE

## 2022-08-05 NOTE — MAU Provider Note (Signed)
None     S [name] is a 36 y.o. [id number] patient who presents to MAU today for preoperative labs for her upcoming surgery. This is done in MAU due to the holiday weekend. Patient without complaints.   O BP 131/85 (BP Location: Right Arm)   Pulse 93   Temp 98.4 F (36.9 C) (Oral)   Resp 20   Ht 5\' 7"  (1.702 m)   Wt 117.9 kg   SpO2 98%   BMI 40.72 kg/m  Physical Exam- not done  A Patient left after lab drawn before provider could see patient.   P Patient left after labs  , Kathryn Moyer 08/05/2022 12:31 PM

## 2022-08-05 NOTE — Progress Notes (Signed)
Discussed BP's reading taken during triage with Rhunette Croft,  CNM to determine if pt needed further evaluation.  CNM states pt may discharged once pre op labs drawn.

## 2022-08-05 NOTE — Progress Notes (Signed)
Pre op instructions reviewed and Chlorahexadine wash given to pt.

## 2022-08-05 NOTE — MAU Note (Signed)
Kathryn Moyer is a 36 y.o. at [redacted]w[redacted]d here in MAU reporting: she's here for Pre Op labs for Cesarean scheduled on 08/08/2022.  Denies LOF or VB.  Endorses +FM. LMP: NA Onset of complaint: NA Pain score: 0 Vitals:   08/05/22 1217  BP: 131/86  Pulse: 91  Resp: 20  Temp: 98.4 F (36.9 C)  SpO2: 98%     FHT: 155 bpm Lab orders placed from triage:  Pre op

## 2022-08-06 LAB — RPR: RPR Ser Ql: NONREACTIVE

## 2022-08-07 ENCOUNTER — Encounter (HOSPITAL_COMMUNITY): Payer: Self-pay | Admitting: Obstetrics and Gynecology

## 2022-08-07 NOTE — H&P (Signed)
Kathryn Moyer is a 37 y.o. female G3P2002 at 46 0/7 weeks (EDD 08/15/2022 by LMP c/w 10 week [place]) presenting for  scheduled repeat c-section at term.   Prenatal care significant for:  1) Carrier of spinal muscular atrophy  FOB neg testing 2)Advanced maternal age gravida  declined NIPT 3) History of cesarean section  LTCS x2, ERLTCS at 67wks Husband planning vasectomy . OB History     Gravida  3   Para  2   Term  2   Preterm      AB      Living  2      SAB      IAB      Ectopic      Multiple  0   Live Births  2         04-02-2014, 41 wks M, 7lbs 3oz  09-07-2017, 39.1 wks M, 8lbs 4oz, Cesarean Section  Past Medical History:  Diagnosis Date   Hx of varicella    Medical history non-contributory    Past Surgical History:  Procedure Laterality Date   ANTERIOR CRUCIATE LIGAMENT REPAIR     R klnee   CESAREAN SECTION N/A 04/02/2014   Procedure: CESAREAN SECTION;  Surgeon: Logan Bores, MD;  Location: [facility];  Service: Obstetrics;  Laterality: N/A;   CESAREAN SECTION N/A 09/07/2017   Procedure: REPEAT CESAREAN SECTION;  Surgeon: Paula Compton, MD;  Location: [place];  Service: Obstetrics;  Laterality: N/A;  Tracey RNFA   Family History: family history includes Autism in her son; Cancer in her maternal grandmother; Diabetes in her maternal grandfather and paternal grandfather; Hearing loss in her paternal grandfather and paternal grandmother; Hypertension in her father and mother. Social History:  reports that she has been smoking cigarettes. She has never used smokeless tobacco. She reports current alcohol use. She reports that she does not use drugs.     Maternal Diabetes: No Genetic Screening: Declined Maternal Ultrasounds/Referrals: Normal Fetal Ultrasounds or other Referrals:  None Maternal Substance Abuse:  No Significant Maternal Medications:  None Significant Maternal Lab Results:  Group B Strep negative Number of Prenatal  Visits:greater than 3 verified prenatal visits Other Comments:  None  Review of Systems  Constitutional:  Negative for fever.  Gastrointestinal:  Negative for abdominal pain.  Genitourinary:  Negative for menstrual problem and vaginal bleeding.   Maternal Medical History:  Contractions: Frequency: irregular.   Perceived severity is mild.   Fetal activity: Perceived fetal activity is normal.   Prenatal complications: Prior c-section x 2, AMA Prenatal Complications - Diabetes: none.     Height 5\' 6"  (1.676 m), weight 114.8 kg, currently breastfeeding. Maternal Exam:  Uterine Assessment: Contraction strength is mild.  Contraction frequency is irregular.  Abdomen: Patient reports no abdominal tenderness. Fetal presentation: vertex Introitus: Normal vulva. Normal vagina.    Physical Exam Cardiovascular:     Rate and Rhythm: Normal rate and regular rhythm.  Pulmonary:     Effort: Pulmonary effort is normal.  Abdominal:     Palpations: Abdomen is soft.  Genitourinary:    General: Normal vulva.  Neurological:     General: No focal deficit present.     Mental Status: She is alert.  Psychiatric:        Mood and Affect: Mood normal.     Prenatal labs: ABO, Rh: --/--/O POS (12/30 1232) Antibody: NEG (12/30 1232) Rubella: Immune (06/13 0000) RPR: NON REACTIVE (12/30 1232)  HBsAg: Negative (06/13 0000)  HIV: Non-reactive (06/13  0000)  GBS:   Negative Hgb AA One hour GCT 121  Assessment/Plan:  Reviewed c-section procedure with patient including risks and benefits of bleeding, infection and possible damage to bowel and bladder.  Pt ready to proceed.  Declines tubal.  She is ready to proceed    Logan Bores 08/07/2022, 7:45 AM

## 2022-08-08 ENCOUNTER — Inpatient Hospital Stay (HOSPITAL_COMMUNITY): Payer: No Typology Code available for payment source | Admitting: Anesthesiology

## 2022-08-08 ENCOUNTER — Other Ambulatory Visit: Payer: Self-pay

## 2022-08-08 ENCOUNTER — Encounter (HOSPITAL_COMMUNITY)
Admission: RE | Disposition: A | Discharge status: Home / Self Care | Payer: Self-pay | Source: Home / Self Care | Attending: Obstetrics and Gynecology

## 2022-08-08 ENCOUNTER — Encounter (HOSPITAL_COMMUNITY): Payer: Self-pay | Admitting: Obstetrics and Gynecology

## 2022-08-08 ENCOUNTER — Inpatient Hospital Stay (HOSPITAL_COMMUNITY)
Admission: RE | Admit: 2022-08-08 | Discharge: 2022-08-11 | DRG: 788 | Disposition: A | Discharge status: Home / Self Care | Payer: No Typology Code available for payment source | Attending: Obstetrics and Gynecology | Admitting: Obstetrics and Gynecology

## 2022-08-08 DIAGNOSIS — Z3A39 39 weeks gestation of pregnancy: Secondary | ICD-10-CM

## 2022-08-08 DIAGNOSIS — D649 Anemia, unspecified: Secondary | ICD-10-CM

## 2022-08-08 DIAGNOSIS — Z98891 History of uterine scar from previous surgery: Secondary | ICD-10-CM

## 2022-08-08 DIAGNOSIS — M545 Low back pain, unspecified: Secondary | ICD-10-CM | POA: Diagnosis not present

## 2022-08-08 DIAGNOSIS — F17210 Nicotine dependence, cigarettes, uncomplicated: Secondary | ICD-10-CM | POA: Diagnosis present

## 2022-08-08 DIAGNOSIS — O9902 Anemia complicating childbirth: Secondary | ICD-10-CM | POA: Diagnosis not present

## 2022-08-08 DIAGNOSIS — Z148 Genetic carrier of other disease: Secondary | ICD-10-CM | POA: Diagnosis not present

## 2022-08-08 DIAGNOSIS — O99214 Obesity complicating childbirth: Secondary | ICD-10-CM | POA: Diagnosis present

## 2022-08-08 DIAGNOSIS — O34211 Maternal care for low transverse scar from previous cesarean delivery: Secondary | ICD-10-CM

## 2022-08-08 DIAGNOSIS — O99892 Other specified diseases and conditions complicating childbirth: Secondary | ICD-10-CM | POA: Diagnosis not present

## 2022-08-08 DIAGNOSIS — O09523 Supervision of elderly multigravida, third trimester: Secondary | ICD-10-CM

## 2022-08-08 DIAGNOSIS — Z3493 Encounter for supervision of normal pregnancy, unspecified, third trimester: Principal | ICD-10-CM

## 2022-08-08 DIAGNOSIS — O99334 Smoking (tobacco) complicating childbirth: Secondary | ICD-10-CM | POA: Diagnosis present

## 2022-08-08 SURGERY — Surgical Case
Anesthesia: Spinal

## 2022-08-08 MED ORDER — SIMETHICONE 80 MG PO CHEW
80.0000 mg | CHEWABLE_TABLET | Freq: Three times a day (TID) | ORAL | Status: DC
  Administered 2022-08-09 – 2022-08-11 (×5): 80 mg via ORAL
  Filled 2022-08-08 (×5): qty 1

## 2022-08-08 MED ORDER — CEFAZOLIN SODIUM-DEXTROSE 2-4 GM/100ML-% IV SOLN
INTRAVENOUS | Status: AC
  Filled 2022-08-08: qty 100

## 2022-08-08 MED ORDER — OXYCODONE HCL 5 MG/5ML PO SOLN: 5.0000 mg | Freq: Once | ORAL | Status: DC | PRN

## 2022-08-08 MED ORDER — WITCH HAZEL-GLYCERIN EX PADS: 1.0000 | MEDICATED_PAD | CUTANEOUS | Status: DC | PRN

## 2022-08-08 MED ORDER — DEXAMETHASONE SODIUM PHOSPHATE 4 MG/ML IJ SOLN
INTRAMUSCULAR | Status: AC
  Filled 2022-08-08: qty 2

## 2022-08-08 MED ORDER — MEPERIDINE HCL 25 MG/ML IJ SOLN: 6.2500 mg | INTRAMUSCULAR | Status: DC | PRN

## 2022-08-08 MED ORDER — FENTANYL CITRATE (PF) 100 MCG/2ML IJ SOLN
INTRAMUSCULAR | Status: AC
  Filled 2022-08-08: qty 2

## 2022-08-08 MED ORDER — NALOXONE HCL 0.4 MG/ML IJ SOLN: 0.4000 mg | INTRAMUSCULAR | Status: DC | PRN

## 2022-08-08 MED ORDER — OXYTOCIN-SODIUM CHLORIDE 30-0.9 UT/500ML-% IV SOLN
INTRAVENOUS | Status: AC
  Filled 2022-08-08: qty 500

## 2022-08-08 MED ORDER — DEXAMETHASONE SODIUM PHOSPHATE 10 MG/ML IJ SOLN
INTRAMUSCULAR | Status: DC | PRN
  Administered 2022-08-08: 8 mg via INTRAVENOUS

## 2022-08-08 MED ORDER — MORPHINE SULFATE (PF) 0.5 MG/ML IJ SOLN
INTRAMUSCULAR | Status: DC | PRN
  Administered 2022-08-08: 150 ug via INTRATHECAL

## 2022-08-08 MED ORDER — POVIDONE-IODINE 10 % EX SWAB
2.0000 | Freq: Once | CUTANEOUS | Status: AC
  Administered 2022-08-08: 2 via TOPICAL

## 2022-08-08 MED ORDER — SENNOSIDES-DOCUSATE SODIUM 8.6-50 MG PO TABS
2.0000 | ORAL_TABLET | Freq: Every day | ORAL | Status: DC
  Administered 2022-08-09 – 2022-08-10 (×2): 2 via ORAL
  Filled 2022-08-08 (×3): qty 2

## 2022-08-08 MED ORDER — HYDROMORPHONE HCL 1 MG/ML IJ SOLN
0.2500 mg | INTRAMUSCULAR | Status: DC | PRN
  Administered 2022-08-08: 0.25 mg via INTRAVENOUS

## 2022-08-08 MED ORDER — ONDANSETRON HCL 4 MG/2ML IJ SOLN: 4.0000 mg | Freq: Once | INTRAMUSCULAR | Status: DC | PRN

## 2022-08-08 MED ORDER — SCOPOLAMINE 1 MG/3DAYS TD PT72
MEDICATED_PATCH | TRANSDERMAL | Status: DC | PRN
  Administered 2022-08-08: 1 via TRANSDERMAL

## 2022-08-08 MED ORDER — FENTANYL CITRATE (PF) 100 MCG/2ML IJ SOLN
INTRAMUSCULAR | Status: DC | PRN
  Administered 2022-08-08: 15 ug via INTRATHECAL

## 2022-08-08 MED ORDER — TRANEXAMIC ACID-NACL 1000-0.7 MG/100ML-% IV SOLN
INTRAVENOUS | Status: AC
  Filled 2022-08-08: qty 100

## 2022-08-08 MED ORDER — NALOXONE HCL 4 MG/10ML IJ SOLN: 1.0000 ug/kg/h | INTRAVENOUS | Status: DC | PRN

## 2022-08-08 MED ORDER — KETOROLAC TROMETHAMINE 30 MG/ML IJ SOLN
INTRAMUSCULAR | Status: DC | PRN
  Administered 2022-08-08: 30 mg via INTRAVENOUS

## 2022-08-08 MED ORDER — SODIUM CHLORIDE 0.9% FLUSH: 3.0000 mL | INTRAVENOUS | Status: DC | PRN

## 2022-08-08 MED ORDER — MORPHINE SULFATE (PF) 0.5 MG/ML IJ SOLN
INTRAMUSCULAR | Status: AC
  Filled 2022-08-08: qty 10

## 2022-08-08 MED ORDER — KETOROLAC TROMETHAMINE 30 MG/ML IJ SOLN: 30.0000 mg | Freq: Once | INTRAMUSCULAR | Status: DC | PRN

## 2022-08-08 MED ORDER — PHENYLEPHRINE HCL-NACL 20-0.9 MG/250ML-% IV SOLN
INTRAVENOUS | Status: DC | PRN
  Administered 2022-08-08: 60 ug/min via INTRAVENOUS

## 2022-08-08 MED ORDER — PHENYLEPHRINE HCL-NACL 20-0.9 MG/250ML-% IV SOLN
INTRAVENOUS | Status: AC
  Filled 2022-08-08: qty 250

## 2022-08-08 MED ORDER — SCOPOLAMINE 1 MG/3DAYS TD PT72
MEDICATED_PATCH | TRANSDERMAL | Status: AC
  Filled 2022-08-08: qty 1

## 2022-08-08 MED ORDER — ONDANSETRON HCL 4 MG/2ML IJ SOLN
INTRAMUSCULAR | Status: AC
  Filled 2022-08-08: qty 2

## 2022-08-08 MED ORDER — SIMETHICONE 80 MG PO CHEW: 80.0000 mg | CHEWABLE_TABLET | ORAL | Status: DC | PRN

## 2022-08-08 MED ORDER — LACTATED RINGERS IV SOLN: INTRAVENOUS | Status: DC

## 2022-08-08 MED ORDER — PHENYLEPHRINE HCL (PRESSORS) 10 MG/ML IV SOLN
INTRAVENOUS | Status: DC | PRN
  Administered 2022-08-08: 80 ug via INTRAVENOUS
  Administered 2022-08-08: 160 ug via INTRAVENOUS

## 2022-08-08 MED ORDER — PHENYLEPHRINE 80 MCG/ML (10ML) SYRINGE FOR IV PUSH (FOR BLOOD PRESSURE SUPPORT)
PREFILLED_SYRINGE | INTRAVENOUS | Status: AC
  Filled 2022-08-08: qty 10

## 2022-08-08 MED ORDER — LACTATED RINGERS IV SOLN: INTRAVENOUS | Status: DC | PRN

## 2022-08-08 MED ORDER — OXYCODONE HCL 5 MG PO TABS
5.0000 mg | ORAL_TABLET | ORAL | Status: DC | PRN
  Administered 2022-08-09: 5 mg via ORAL
  Administered 2022-08-10 (×2): 10 mg via ORAL
  Filled 2022-08-08 (×2): qty 2
  Filled 2022-08-08: qty 1

## 2022-08-08 MED ORDER — OXYTOCIN-SODIUM CHLORIDE 30-0.9 UT/500ML-% IV SOLN
INTRAVENOUS | Status: DC | PRN
  Administered 2022-08-08: 400 mL via INTRAVENOUS

## 2022-08-08 MED ORDER — DIPHENHYDRAMINE HCL 25 MG PO CAPS: 25.0000 mg | ORAL_CAPSULE | Freq: Four times a day (QID) | ORAL | Status: DC | PRN

## 2022-08-08 MED ORDER — TRANEXAMIC ACID-NACL 1000-0.7 MG/100ML-% IV SOLN
INTRAVENOUS | Status: DC | PRN
  Administered 2022-08-08: 1000 mg via INTRAVENOUS

## 2022-08-08 MED ORDER — SCOPOLAMINE 1 MG/3DAYS TD PT72: 1.0000 | MEDICATED_PATCH | Freq: Once | TRANSDERMAL | Status: DC

## 2022-08-08 MED ORDER — DIPHENHYDRAMINE HCL 25 MG PO CAPS: 25.0000 mg | ORAL_CAPSULE | ORAL | Status: DC | PRN

## 2022-08-08 MED ORDER — PRENATAL MULTIVITAMIN CH
1.0000 | ORAL_TABLET | Freq: Every day | ORAL | Status: DC
  Administered 2022-08-09 – 2022-08-11 (×3): 1 via ORAL
  Filled 2022-08-08 (×3): qty 1

## 2022-08-08 MED ORDER — COCONUT OIL OIL: 1.0000 | TOPICAL_OIL | Status: DC | PRN

## 2022-08-08 MED ORDER — ACETAMINOPHEN 500 MG PO TABS
1000.0000 mg | ORAL_TABLET | Freq: Four times a day (QID) | ORAL | Status: DC
  Administered 2022-08-08 – 2022-08-11 (×9): 1000 mg via ORAL
  Filled 2022-08-08 (×10): qty 2

## 2022-08-08 MED ORDER — ACETAMINOPHEN 500 MG PO TABS: 1000.0000 mg | ORAL_TABLET | Freq: Four times a day (QID) | ORAL | Status: DC

## 2022-08-08 MED ORDER — MENTHOL 3 MG MT LOZG: 1.0000 | LOZENGE | OROMUCOSAL | Status: DC | PRN

## 2022-08-08 MED ORDER — LACTATED RINGERS IV SOLN: Freq: Once | INTRAVENOUS | Status: AC

## 2022-08-08 MED ORDER — ACETAMINOPHEN 10 MG/ML IV SOLN
INTRAVENOUS | Status: AC
  Filled 2022-08-08: qty 100

## 2022-08-08 MED ORDER — KETOROLAC TROMETHAMINE 30 MG/ML IJ SOLN: 30.0000 mg | Freq: Four times a day (QID) | INTRAMUSCULAR | Status: AC | PRN

## 2022-08-08 MED ORDER — DIBUCAINE (PERIANAL) 1 % EX OINT: 1.0000 | TOPICAL_OINTMENT | CUTANEOUS | Status: DC | PRN

## 2022-08-08 MED ORDER — OXYTOCIN-SODIUM CHLORIDE 30-0.9 UT/500ML-% IV SOLN: 2.5000 [IU]/h | INTRAVENOUS | Status: AC

## 2022-08-08 MED ORDER — HYDROMORPHONE HCL 1 MG/ML IJ SOLN
INTRAMUSCULAR | Status: AC
  Filled 2022-08-08: qty 0.5

## 2022-08-08 MED ORDER — ONDANSETRON HCL 4 MG/2ML IJ SOLN
4.0000 mg | Freq: Three times a day (TID) | INTRAMUSCULAR | Status: DC | PRN
  Administered 2022-08-08: 4 mg via INTRAVENOUS
  Filled 2022-08-08: qty 2

## 2022-08-08 MED ORDER — BUPIVACAINE IN DEXTROSE 0.75-8.25 % IT SOLN
INTRATHECAL | Status: DC | PRN
  Administered 2022-08-08: 1.6 mL via INTRATHECAL

## 2022-08-08 MED ORDER — OXYCODONE HCL 5 MG PO TABS: 5.0000 mg | ORAL_TABLET | Freq: Once | ORAL | Status: DC | PRN

## 2022-08-08 MED ORDER — METOCLOPRAMIDE HCL 5 MG/ML IJ SOLN
INTRAMUSCULAR | Status: AC
  Filled 2022-08-08: qty 2

## 2022-08-08 MED ORDER — AMISULPRIDE (ANTIEMETIC) 5 MG/2ML IV SOLN: 10.0000 mg | Freq: Once | INTRAVENOUS | Status: DC | PRN

## 2022-08-08 MED ORDER — ONDANSETRON HCL 4 MG/2ML IJ SOLN
INTRAMUSCULAR | Status: DC | PRN
  Administered 2022-08-08: 4 mg via INTRAVENOUS

## 2022-08-08 MED ORDER — CEFAZOLIN SODIUM-DEXTROSE 2-4 GM/100ML-% IV SOLN
2.0000 g | INTRAVENOUS | Status: AC
  Administered 2022-08-08: 2 g via INTRAVENOUS

## 2022-08-08 MED ORDER — ACETAMINOPHEN 10 MG/ML IV SOLN
INTRAVENOUS | Status: DC | PRN
  Administered 2022-08-08: 1000 mg via INTRAVENOUS

## 2022-08-08 MED ORDER — DIPHENHYDRAMINE HCL 50 MG/ML IJ SOLN: 12.5000 mg | INTRAMUSCULAR | Status: DC | PRN

## 2022-08-08 MED ORDER — IBUPROFEN 600 MG PO TABS
600.0000 mg | ORAL_TABLET | Freq: Four times a day (QID) | ORAL | Status: DC
  Administered 2022-08-08 – 2022-08-11 (×10): 600 mg via ORAL
  Filled 2022-08-08 (×10): qty 1

## 2022-08-08 MED ORDER — ZOLPIDEM TARTRATE 5 MG PO TABS: 5.0000 mg | ORAL_TABLET | Freq: Every evening | ORAL | Status: DC | PRN

## 2022-08-08 SURGICAL SUPPLY — 39 items
APL PRP STRL LF DISP 70% ISPRP (MISCELLANEOUS) ×2
APL SKNCLS STERI-STRIP NONHPOA (GAUZE/BANDAGES/DRESSINGS) ×1
BENZOIN TINCTURE PRP APPL 2/3 (GAUZE/BANDAGES/DRESSINGS) IMPLANT
CHLORAPREP W/TINT 26 (MISCELLANEOUS) ×4 IMPLANT
CLAMP UMBILICAL CORD (MISCELLANEOUS) ×2 IMPLANT
CLOTH BEACON ORANGE TIMEOUT ST (SAFETY) ×2 IMPLANT
DRSG OPSITE POSTOP 4X10 (GAUZE/BANDAGES/DRESSINGS) ×2 IMPLANT
ELECT REM PT RETURN 9FT ADLT (ELECTROSURGICAL) ×1
ELECTRODE REM PT RTRN 9FT ADLT (ELECTROSURGICAL) ×2 IMPLANT
EXTRACTOR VACUUM KIWI (MISCELLANEOUS) IMPLANT
GAUZE SPONGE 4X4 12PLY STRL LF (GAUZE/BANDAGES/DRESSINGS) IMPLANT
GLOVE BIO SURGEON STRL SZ 6.5 (GLOVE) ×2 IMPLANT
GLOVE BIOGEL PI IND STRL 7.0 (GLOVE) ×2 IMPLANT
GOWN STRL REUS W/TWL LRG LVL3 (GOWN DISPOSABLE) ×4 IMPLANT
KIT ABG SYR 3ML LUER SLIP (SYRINGE) IMPLANT
MAT PREVALON FULL STRYKER (MISCELLANEOUS) IMPLANT
NDL HYPO 25X5/8 SAFETYGLIDE (NEEDLE) IMPLANT
NEEDLE HYPO 25X5/8 SAFETYGLIDE (NEEDLE) IMPLANT
NS IRRIG 1000ML POUR BTL (IV SOLUTION) ×2 IMPLANT
PACK C SECTION WH (CUSTOM PROCEDURE TRAY) ×2 IMPLANT
PAD ABD 7.5X8 STRL (GAUZE/BANDAGES/DRESSINGS) IMPLANT
PAD OB MATERNITY 4.3X12.25 (PERSONAL CARE ITEMS) ×2 IMPLANT
RTRCTR C-SECT PINK 25CM LRG (MISCELLANEOUS) ×2 IMPLANT
STRIP CLOSURE SKIN 1/2X4 (GAUZE/BANDAGES/DRESSINGS) IMPLANT
SUT CHROMIC 1 CTX 36 (SUTURE) ×4 IMPLANT
SUT PLAIN 0 NONE (SUTURE) IMPLANT
SUT PLAIN 2 0 XLH (SUTURE) ×2 IMPLANT
SUT VIC AB 0 CT1 27 (SUTURE) ×2
SUT VIC AB 0 CT1 27XBRD ANBCTR (SUTURE) ×4 IMPLANT
SUT VIC AB 2-0 CT1 27 (SUTURE) ×1
SUT VIC AB 2-0 CT1 TAPERPNT 27 (SUTURE) ×2 IMPLANT
SUT VIC AB 3-0 CT1 27 (SUTURE)
SUT VIC AB 3-0 CT1 TAPERPNT 27 (SUTURE) IMPLANT
SUT VIC AB 3-0 SH 27 (SUTURE) ×1
SUT VIC AB 3-0 SH 27X BRD (SUTURE) IMPLANT
SUT VIC AB 4-0 KS 27 (SUTURE) ×2 IMPLANT
TOWEL OR 17X24 6PK STRL BLUE (TOWEL DISPOSABLE) ×2 IMPLANT
TRAY FOLEY W/BAG SLVR 14FR LF (SET/KITS/TRAYS/PACK) ×2 IMPLANT
WATER STERILE IRR 1000ML POUR (IV SOLUTION) ×2 IMPLANT

## 2022-08-08 NOTE — Op Note (Signed)
Operative Note  An experienced assistant was required given the standard of surgical care given the complexity of the case.  This assistant was needed for exposure, dissection, suctioning, retraction, instrument exchange, assisting with delivery with administration of fundal pressure, and for overall help during the procedure.   Preoperative Diagnosis Term pregnancy at 59 0/7 weeks Prior c-section x 2  Postoperative Diagnosis same  Procedure Repeat low transverse c-section with two layer closure of uterus  Surgeon Paula Compton, MD  Anesthesia Spinal  Fluids: EBL 233mL UOP 247mL IVF 1710mL LR  Findings A viable female infant in the vertex presentation.  Apgars 8,9 and weight pending. Uterus tubes and ovaries were WNL. Double nuchal cord noted.  Specimen Placenta to L&D  Procedure Note Patient was taken to the operating room where spinal anesthesia was obtained and found to be adequate by Allis clamp test. She was prepped and draped in the normal sterile fashion in the dorsal supine position with a leftward tilt. An appropriate time out was performed. A Pfannenstiel skin incision was then made through a pre-existing scar with the scalpel and carried through to the underlying layer of fascia by sharp dissection and Bovie cautery. The fascia was nicked in the midline and the incision was extended laterally with Mayo scissors. The inferior aspect of the incision was grasped Coker clamps and dissected off the underlying rectus muscles. In a similar fashion the superior aspect was dissected off the rectus muscles. Rectus muscles were separated in the midline and the peritoneal cavity entered bluntly. The peritoneal incision was then extended both superiorly and inferiorly with careful attention to avoid both bowel and bladder. The Alexis self-retaining wound retractor was then placed within the incision and the lower uterine segment exposed. The bladder flap was developed with Metzenbaum  scissors and pushed away from the lower uterine segment. The lower uterine segment was then incised in a transverse fashion and the cavity itself entered bluntly. The incision was extended bluntly. The infant's head was then lifted and delivered from the incision without difficulty. There was a double nuchal cord reduced.  The remainder of the infant delivered and the nose and mouth bulb suctioned with the cord clamped and cut as well. The infant was handed off to the waiting pediatricians. The placenta was then spontaneously expressed from the uterus and the uterus cleared of all clots and debris with moist lap sponge. The uterine incision was then repaired in 2 layers the first layer was a running locked layer of 1-0 chromic and the second an imbricating layer of the same suture. The tubes and ovaries were inspected and the gutters cleared of all clots and debris. The uterine incision was inspected and found to be hemostatic. All instruments and sponges as well as the Alexis retractor were then removed from the abdomen. The rectus muscles and peritoneum were then reapproximated with a running suture of 2-0 Vicryl. The fascia was then closed with 0 Vicryl in a running fashion. Subcutaneous tissue was reapproximated with 3-0 plain in a running fashion. The skin was closed with a subcuticular stitch of 4-0 Vicryl on a Keith needle and then reinforced with benzoin and Steri-Strips. At the conclusion of the procedure all instruments and sponge counts were correct. Patient was taken to the recovery room in good condition with her baby accompanying her skin to skin.

## 2022-08-08 NOTE — Anesthesia Postprocedure Evaluation (Signed)
Anesthesia Post Note  Patient: Kathryn Moyer  Procedure(s) Performed: CESAREAN SECTION     Patient location during evaluation: PACU Anesthesia Type: Spinal Level of consciousness: awake and alert and oriented Pain management: pain level controlled Vital Signs Assessment: post-procedure vital signs reviewed and stable Respiratory status: spontaneous breathing, nonlabored ventilation and respiratory function stable Cardiovascular status: blood pressure returned to baseline and stable Postop Assessment: no headache, no backache, spinal receding and no apparent nausea or vomiting Anesthetic complications: no   No notable events documented.  Last Vitals:  Vitals:   08/08/22 1500 08/08/22 1510  BP: 115/73 121/69  Pulse: 76 75  Resp: 14 15  Temp:    SpO2: 97% 96%    Last Pain:  Vitals:   08/08/22 1430  TempSrc:   PainSc: 4    Pain Goal: Patients Stated Pain Goal: 5 (08/08/22 1430)              Epidural/Spinal Function Cutaneous sensation: Vague (08/08/22 1430), Patient able to flex knees: No (08/08/22 1430), Patient able to lift hips off bed: No (08/08/22 1430), Back pain beyond tenderness at insertion site: No (08/08/22 1430), Progressively worsening motor and/or sensory loss: No (08/08/22 1430), Bowel and/or bladder incontinence post epidural: No (08/08/22 1430)  Pervis Hocking

## 2022-08-08 NOTE — Lactation Note (Signed)
This note was copied from a baby's chart. Lactation Consultation Note  Patient Name: Kathryn Moyer [id number] Date: 08/08/2022 Reason for consult: Initial assessment;Hyperbilirubinemia;Term (+ Dat) Age:39 hours  P3, Repeat C/S  Assisted mother with latching infant in football position. Infant showing cues, basic teaching, latched well. Taught alternate breast massage to encourage feeding. Infant's tongue was noted to have frenulum attached near the tip of the tongue. With minimal suck training and assist with deep latch, baby fed with ease, mother denied discomfort and nipple was rounded when baby self released from the breast.   Pumping was initiated at mother's request and due to + Dat and risk of jaundice.     Maternal Data Has patient been taught Hand Expression?: Yes Does the patient have breastfeeding experience prior to this delivery?: Yes How long did the patient breastfeed?: First baby was in NICU, Pumped x 4 months: second baby was breastfed for 13 months. Both babies had jaundice requiring phototherapy.  Feeding Mother's Current Feeding Choice: Breast Milk  LATCH Score Latch: Repeated attempts needed to sustain latch, nipple held in mouth throughout feeding, stimulation needed to elicit sucking reflex.  Audible Swallowing: A few with stimulation  Type of Nipple: Everted at rest and after stimulation  Comfort (Breast/Nipple): Soft / non-tender  Hold (Positioning): Assistance needed to correctly position infant at breast and maintain latch.  LATCH Score: 7   Lactation Tools Discussed/Used Tools: Pump Breast pump type: Double-Electric Breast Pump Pump Education: Setup, frequency, and cleaning;Milk Storage Reason for Pumping: Induce lactation, infant Dat + Pumping frequency: Instructed to pump q 3 hrs, especially if baby is not latching well  Interventions Interventions: Breast feeding basics reviewed;Assisted with latch;Skin to skin;Hand express;Breast  compression;Support pillows;Adjust position;DEBP;Position options;Education;LC Services brochure  Discharge    Consult Status Consult Status: Follow-up Date: 08/09/22 Follow-up type: In-patient    Stana Bunting M 08/08/2022, 6:40 PM

## 2022-08-08 NOTE — Transfer of Care (Signed)
Immediate Anesthesia Transfer of Care Note  Patient: Kathryn Moyer  Procedure(s) Performed: CESAREAN SECTION  Patient Location: PACU  Anesthesia Type:Spinal  Level of Consciousness: awake, alert , and oriented  Airway & Oxygen Therapy: Patient Spontanous Breathing  Post-op Assessment: Report given to RN and Post -op Vital signs reviewed and stable  Post vital signs: Reviewed and stable  Last Vitals:  Vitals Value Taken Time  BP 99/88 08/08/22 1411  Temp    Pulse 79 08/08/22 1413  Resp 15 08/08/22 1413  SpO2 96 % 08/08/22 1413  Vitals shown include unvalidated device data.  Last Pain:  Vitals:   08/08/22 1117  TempSrc: Oral         Complications: No notable events documented.

## 2022-08-08 NOTE — Interval H&P Note (Signed)
History and Physical Interval Note:  08/08/2022 12:16 PM  Kathryn Moyer  has presented today for surgery, with the diagnosis of repeat c-section.  The various methods of treatment have been discussed with the patient and family. After consideration of risks, benefits and other options for treatment, the patient has consented to  Procedure(s): CESAREAN SECTION (N/A) as a surgical intervention.  The patient's history has been reviewed, patient examined, no change in status, stable for surgery.  I have reviewed the patient's chart and labs.  Questions were answered to the patient's satisfaction.     Logan Bores

## 2022-08-08 NOTE — Anesthesia Procedure Notes (Signed)
Spinal  Patient location during procedure: OR Start time: 08/08/2022 12:40 PM End time: 08/08/2022 12:46 PM Reason for block: surgical anesthesia Staffing Performed: anesthesiologist  Anesthesiologist: Pervis Hocking, DO Performed by: Pervis Hocking, DO Authorized by: Pervis Hocking, DO   Preanesthetic Checklist Completed: patient identified, IV checked, risks and benefits discussed, surgical consent, monitors and equipment checked, pre-op evaluation and timeout performed Spinal Block Patient position: sitting Prep: DuraPrep and site prepped and draped Patient monitoring: cardiac monitor, continuous pulse ox and blood pressure Approach: midline Location: L3-4 Injection technique: single-shot Needle Needle type: Pencan  Needle gauge: 24 G Needle length: 9 cm Assessment Sensory level: T6 Events: CSF return Additional Notes Functioning IV was confirmed and monitors were applied. Sterile prep and drape, including hand hygiene and sterile gloves were used. The patient was positioned and the spine was prepped. The skin was anesthetized with lidocaine.  Free flow of clear CSF was obtained prior to injecting local anesthetic into the CSF.  The spinal needle aspirated freely following injection.  The needle was carefully withdrawn.  The patient tolerated the procedure well.

## 2022-08-09 LAB — CBC
HCT: 28.9 % — ABNORMAL LOW (ref 36.0–46.0)
Hemoglobin: 9.7 g/dL — ABNORMAL LOW (ref 12.0–15.0)
MCH: 30.3 pg (ref 26.0–34.0)
MCHC: 33.6 g/dL (ref 30.0–36.0)
MCV: 90.3 fL (ref 80.0–100.0)
Platelets: 332 10*3/uL (ref 150–400)
RBC: 3.2 MIL/uL — ABNORMAL LOW (ref 3.87–5.11)
RDW: 14.4 % (ref 11.5–15.5)
WBC: 15.7 10*3/uL — ABNORMAL HIGH (ref 4.0–10.5)
nRBC: 0.2 % (ref 0.0–0.2)

## 2022-08-09 NOTE — Progress Notes (Signed)
Subjective: Postpartum Day 1: Cesarean Delivery Patient reports crampy pain with breast feeding. Incisional pain well controlled. No nausea or vomiting. Tolerating po  Objective: Vital signs in last 24 hours: Temp:  [97.7 F (36.5 C)-98.1 F (36.7 C)] 98 F (36.7 C) (01/03 0430) Pulse Rate:  [66-78] 69 (01/03 0827) Resp:  [16-18] 18 (01/03 0827) BP: (109-118)/(54-71) 116/58 (01/03 0827) SpO2:  [98 %-100 %] 99 % (01/03 0827)  Physical Exam:  General: alert, cooperative, and appears stated age Lochia: appropriate Uterine Fundus: firm Incision: healing well DVT Evaluation: No evidence of DVT seen on physical exam.  Recent Labs    08/09/22 0524  HGB 9.7*  HCT 28.9*    Assessment/Plan: Status post Cesarean section. Doing well postoperatively.  Continue current care. Desires neonatal circumcision, R/B/A of procedure discussed at length. Pt understands that neonatal circumcision is not considered medically necessary and is elective. The risks include, but are not limited to bleeding, infection, damage to the penis, development of scar tissue, and having to have it redone at a later date. Pt understands theses risks and wishes to proceed. Pt would prefer to wait for Dr. Marvel Plan to perform circ   Vanessa Kick, MD 08/09/2022, 5:32 PM

## 2022-08-10 MED ORDER — CYCLOBENZAPRINE HCL 5 MG PO TABS
10.0000 mg | ORAL_TABLET | Freq: Three times a day (TID) | ORAL | Status: DC | PRN
  Administered 2022-08-10 – 2022-08-11 (×3): 10 mg via ORAL
  Filled 2022-08-10 (×3): qty 2

## 2022-08-10 NOTE — Lactation Note (Signed)
This note was copied from a baby's chart. Lactation Consultation Note  Patient Name: Kathryn Moyer'R (Age 37) Date: 08/10/2022 Reason for consult: Follow-up assessment Age:37 years LC hanged void and stool diaper while in room. P3, term female infant. Birth Parent wanted latch assistance infant BF 1 time today has mostly been formula feed. Per Birth Parent she had latch difficulties with 3rd child did not experienced this with her 2nd child whom she BF for over 1 year. Birth Parent had a little breast fullness and latched infant on her left breast using the cross cradle hold, infant latched with depth, swallows heard, and infant BF for 15 minutes, infant was supplemented with 12 mls of formula by LC, while Birth Parent was using the DEBP, Birth Parent had expressed 15 mls of EBM and was still pumping when LC left the room. Birth Parent understands that EBM is safe at room temperature for 4 hours whereas formula must be used within 1 hour. Birth Parent feeding plan: 1- Birth Parent will continue to latch infant 1st for every feeding, BF according to hunger cues, on demand, STS and afterwards plans to supplement infant with her own EBM first before offering formula. 2- Birth Parent will continue to use DEBP every 3 hours for 15 minutes on initial setting. 3- Birth Parent will ask RN/LC for further latch assistance if needed.  Maternal Data    Feeding Mother's Current Feeding Choice: Breast Milk and Formula Nipple Type: Nfant Extra Slow Flow (gold)  LATCH Score Latch: Grasps breast easily, tongue down, lips flanged, rhythmical sucking.  Audible Swallowing: Spontaneous and intermittent  Type of Nipple: Everted at rest and after stimulation  Comfort (Breast/Nipple): Soft / non-tender  Hold (Positioning): Assistance needed to correctly position infant at breast and maintain latch.  LATCH Score: 9   Lactation Tools Discussed/Used Breast pump type: Double-Electric Breast Pump Pumping  frequency: Continue to use DEBP every 3 hours for 15 minutes on inital setting. Pumped volume: 15 mL (Birth Parent was still expressing colostrum when LC left the room.)  Interventions Interventions: Assisted with latch;Skin to skin;Position options;Support pillows;Adjust position;Expressed milk;Breast compression;Education  Discharge    Consult Status Consult Status: Follow-up Date: 08/11/22 Follow-up type: In-patient    Eulis Canner 08/10/2022, 6:56 PM

## 2022-08-10 NOTE — Lactation Note (Signed)
This note was copied from a baby's chart. Lactation Consultation Note  Patient Name: Kathryn Moyer [id number] Date: 08/10/2022   Age:39 hours   LC Note:  Attempted to visit with mother, however, she was asleep.   Maternal Data    Feeding    LATCH Score                    Lactation Tools Discussed/Used    Interventions    Discharge    Consult Status      Sheylin Scharnhorst R Paschal Blanton 08/10/2022, 1:38 PM

## 2022-08-10 NOTE — Progress Notes (Signed)
Subjective: Postpartum Day 2: Cesarean Delivery Patient reports tolerating PO and no problems voiding.  She has had low mid back pain that is not totally relieved with oxycodone and feels is muscle spasms.  Pumping breast milk and working on latch.  Objective: Vital signs in last 24 hours: Temp:  [97.9 F (36.6 C)] 97.9 F (36.6 C) (01/04 0529) Pulse Rate:  [84] 84 (01/04 0529) Resp:  [17] 17 (01/04 0529) BP: (131)/(77-84) 131/77 (01/04 0529) SpO2:  [100 %] 100 % (01/04 0529)  Physical Exam:  General: alert and cooperative Lochia: appropriate Uterine Fundus: firm Incision: C/D/I   Recent Labs    08/09/22 0524  HGB 9.7*  HCT 28.9*    Assessment/Plan: Status post Cesarean section. Doing well postoperatively.   Will try flexeril and abdominal binder for back pain and to help increase ambulation. Consented for circumcision and will proceed.    Logan Bores, MD 08/10/2022, 11:58 AM

## 2022-08-11 MED ORDER — OXYCODONE-ACETAMINOPHEN 5-325 MG PO TABS: 1.0000 | ORAL_TABLET | ORAL | 0 refills | Status: AC | PRN

## 2022-08-11 MED ORDER — IBUPROFEN 600 MG PO TABS: 600.0000 mg | ORAL_TABLET | Freq: Four times a day (QID) | ORAL | 1 refills | Status: DC | PRN

## 2022-08-11 MED ORDER — CYCLOBENZAPRINE HCL 10 MG PO TABS: 10.0000 mg | ORAL_TABLET | Freq: Three times a day (TID) | ORAL | 0 refills | Status: DC | PRN

## 2022-08-11 NOTE — Discharge Summary (Signed)
Postpartum Discharge Summary  Date of Service updated      Patient Name: Kathryn Moyer DOB: [birth year] MRN: [id number]  Date of admission: 08/08/2022 Delivery date:08/08/2022  Delivering provider: Paula Compton  Date of discharge: 08/11/2022  Admitting diagnosis: S/P repeat low transverse C-section [Z98.891] Intrauterine pregnancy: [redacted]w[redacted]d     Secondary diagnosis:  Principal Problem:   S/P repeat low transverse C-section  Additional problems: none    Discharge diagnosis: Term Pregnancy Delivered                                              Post partum procedures: n/a Augmentation: N/A Complications: None  Hospital course: Sceduled C/S   37 y.o. yo [id number] at [redacted]w[redacted]d was admitted to the hospital 08/08/2022 for scheduled cesarean section with the following indication:Elective Repeat.Delivery details are as follows:  Membrane Rupture Time/Date: 1:14 PM ,08/08/2022   Delivery Method:C-Section, Low Transverse  Details of operation can be found in separate operative note.  Patient had a postpartum course complicated byn/a.  She is ambulating, tolerating a regular diet, passing flatus, and urinating well. Patient is discharged home in stable condition on  08/11/22        Newborn Data: Birth date:08/08/2022  Birth time:1:14 PM  Gender:Female  Living status:Living  Apgars:9 ,9  Weight:3260 g     Magnesium Sulfate received: No BMZ received: No  Physical exam  Vitals:   08/10/22 0529 08/10/22 1401 08/10/22 2140 08/11/22 0545  BP: 131/77 133/71 134/79 126/71  Pulse: 84 87 86 89  Resp: 17 18 18 17   Temp: 97.9 F (36.6 C) 98.3 F (36.8 C) 97.8 F (36.6 C) 97.7 F (36.5 C)  TempSrc: Oral Oral Oral Oral  SpO2: 100% 100% 100% 100%  Weight:      Height:       General: alert, cooperative, and no distress Lochia: appropriate Uterine Fundus: firm Incision: Healing well with no significant drainage DVT Evaluation: No evidence of DVT seen on physical exam. Labs: Lab Results  Component  Value Date   WBC 15.7 (H) 08/09/2022   HGB 9.7 (L) 08/09/2022   HCT 28.9 (L) 08/09/2022   MCV 90.3 08/09/2022   PLT 332 08/09/2022      Latest Ref Rng & Units 06/30/2020   11:42 AM  CMP  Glucose 65 - 99 mg/dL 80   BUN 6 - 20 mg/dL 11   Creatinine 0.57 - 1.00 mg/dL 0.92   Sodium 134 - 144 mmol/L 142   Potassium 3.5 - 5.2 mmol/L 4.4   Chloride 96 - 106 mmol/L 105   CO2 20 - 29 mmol/L 26   Calcium 8.7 - 10.2 mg/dL 9.3   Total Protein 6.0 - 8.5 g/dL 6.8   Total Bilirubin 0.0 - 1.2 mg/dL 0.5   Alkaline Phos 44 - 121 IU/L 52   AST 0 - 40 IU/L 13   ALT 0 - 32 IU/L 14    Edinburgh Score:    08/08/2022    4:27 PM  Edinburgh Postnatal Depression Scale Screening Tool  I have been able to laugh and see the funny side of things. 0  I have looked forward with enjoyment to things. 0  I have blamed myself unnecessarily when things went wrong. 1  I have been anxious or worried for no good reason. 2  I have felt scared or panicky for no  good reason. 1  Things have been getting on top of me. 0  I have been so unhappy that I have had difficulty sleeping. 0  I have felt sad or miserable. 0  I have been so unhappy that I have been crying. 0  The thought of harming myself has occurred to me. 0  Edinburgh Postnatal Depression Scale Total 4      After visit meds:  Allergies as of 08/11/2022       Reactions   Other Hives, Rash   *foods with high acidity*        Medication List     TAKE these medications    cyclobenzaprine 10 MG tablet Commonly known as: FLEXERIL Take 1 tablet (10 mg total) by mouth 3 (three) times daily as needed for muscle spasms.   ibuprofen 600 MG tablet Commonly known as: ADVIL Take 1 tablet (600 mg total) by mouth every 6 (six) hours as needed for moderate pain or cramping.   oxyCODONE-acetaminophen 5-325 MG tablet Commonly known as: Percocet Take 1 tablet by mouth every 4 (four) hours as needed for up to 7 days for severe pain.   prenatal multivitamin  Tabs tablet Take 1 tablet by mouth daily at 12 noon.         Discharge home in stable condition Infant Feeding: Breast Infant Disposition:home with mother Discharge instruction: per After Visit Summary and Postpartum booklet. Activity: Advance as tolerated. Pelvic rest for 6 weeks.  Diet: routine diet Anticipated Birth Control:  vasectomy for FOB Postpartum Appointment:6 weeks Additional Postpartum F/U: Incision check 2 weeks Future Appointments:No future appointments. Follow up Visit:  Follow-up Information     Paula Compton, MD. Schedule an appointment as soon as possible for a visit in 2 week(s).   Specialty: Obstetrics and Gynecology Why: Incision check Contact information: [address] [place] [address] [phone]                     08/11/2022 Isaiah Serge, DO

## 2022-08-11 NOTE — Plan of Care (Signed)
  Problem: Education: Goal: Knowledge of General Education information will improve Description: Including pain rating scale, medication(s)/side effects and non-pharmacologic comfort measures Outcome: Completed/Met   Problem: Health Behavior/Discharge Planning: Goal: Ability to manage health-related needs will improve Outcome: Completed/Met   Problem: Clinical Measurements: Goal: Ability to maintain clinical measurements within normal limits will improve Outcome: Completed/Met Goal: Will remain free from infection Outcome: Completed/Met Goal: Diagnostic test results will improve Outcome: Completed/Met Goal: Respiratory complications will improve Outcome: Completed/Met Goal: Cardiovascular complication will be avoided Outcome: Completed/Met   Problem: Activity: Goal: Risk for activity intolerance will decrease Outcome: Completed/Met Problem: Elimination: Goal: Will not experience complications related to bowel motility Outcome: Completed/Met Goal: Will not experience complications related to urinary retention Outcome: Completed/Met   Problem: Pain Managment: Goal: General experience of comfort will improve Outcome: Completed/Met   Problem: Safety: Goal: Ability to remain free from injury will improve Outcome: Completed/Met   Problem: Skin Integrity: Goal: Risk for impaired skin integrity will decrease Outcome: Completed/Met   Problem: Role Relationship: Goal: Will demonstrate positive interactions with the child Outcome: Completed/Met   Problem: Safety: Goal: Risk of complications during labor and delivery will decrease Outcome: Completed/Met   Problem: Pain Management: Goal: Relief or control of pain from uterine contractions will improve Outcome: Completed/Met   Problem: Self-Concept: Goal: Communication of feelings regarding changes in body function or appearance will improve Outcome: Completed/Met   Problem: Skin Integrity: Goal: Demonstration of wound  healing without infection will improve Outcome: Completed/Met   Problem: Education: Goal: Knowledge of condition will improve Outcome: Completed/Met     Problem: Nutrition: Goal: Adequate nutrition will be maintained Outcome: Completed/Met   Problem: Coping: Goal: Level of anxiety will decrease Outcome: Completed/Met

## 2022-08-11 NOTE — Progress Notes (Signed)
Subjective: Postpartum Day 3: Cesarean Delivery Patient reports tolerating PO, + flatus, + BM, and no problems voiding. Lochia mild. Denies HA, CP, SOB or lightheadedness. Flexeril helped back pain. Making lots of colustrum though baby not latching well. Feels ready for discharge to home today  Objective: Vital signs in last 24 hours: Temp:  [97.7 F (36.5 C)-98.3 F (36.8 C)] 97.7 F (36.5 C) (01/05 0545) Pulse Rate:  [86-89] 89 (01/05 0545) Resp:  [17-18] 17 (01/05 0545) BP: (126-134)/(71-79) 126/71 (01/05 0545) SpO2:  [100 %] 100 % (01/05 0545)  Physical Exam:  General: alert, cooperative, and no distress Lochia: appropriate Uterine Fundus: firm Incision: no significant drainage DVT Evaluation: No evidence of DVT seen on physical exam.  Recent Labs    08/09/22 0524  HGB 9.7*  HCT 28.9*    Assessment/Plan: Status post Cesarean section. Doing well postoperatively.  Discharge home with standard precautions and return to clinic in 2 weeks for incision check and 6 weeks for postpartum visit  Rx sent for ibuprofen, percocet and flexeril.   Isaiah Serge, DO 08/11/2022, 8:42 AM

## 2022-08-11 NOTE — Discharge Instructions (Signed)
Call office with any concerns [phone] 

## 2022-08-11 NOTE — Lactation Note (Signed)
This note was copied from a baby's chart. Lactation Consultation Note  Patient Name: Kathryn Moyer [id number] Date: 08/11/2022 Age: 39 hours  Reason for consult: Follow-up assessment, P3, term, jaundice not requiring phototherapy, experienced BF mother, difficult latch with limited tongue extension  Mother's milk is coming to volume and she is able to easily express 35-50 ml. She has been feeding expressed milk with bottle due to infant not sustaining or refusing to latch to breast. Efforts were made to assist baby to breast using a nipple shield, as suggested by mother. EBM added to NS once in place on mother's breast. Infant became fussy and arching away from breast, crying. Baby calmed and soothed when fed by bottle. He took 20-25 ml of EBM. Once infant was calm, efforts made to latch baby with shield. This attempt was better and infant suckled shallow for a few minutes. Mother is motivated to feed baby at breast and she was receptive for [facility] to request an OP Lactation follow up call/ visit. Given resources for Tongue and Lip Resources information sheet. Mother plans to continue to pump, bottle feed EBM and will attempt NS as baby tolerates.      Feeding Mother's Current Feeding Choice: Breast Milk and Formula Nipple Type: Slow - flow  LATCH Score Latch: Repeated attempts needed to sustain latch, nipple held in mouth throughout feeding, stimulation needed to elicit sucking reflex. (suckled shallow on nipple shield after baby was calmed with bottle feeding. Infant held breast in his mouth but did not latch nutratively at breast, once calmed.)  Audible Swallowing: A few with stimulation (when NS applied and calm)  Type of Nipple: Everted at rest and after stimulation  Comfort (Breast/Nipple): Filling, red/small blisters or bruises, mild/mod discomfort  Hold (Positioning): Assistance needed to correctly position infant at breast and maintain latch.  LATCH Score: 6   Lactation Tools  Discussed/Used Tools: Nipple Shields Nipple shield size: 20 Breast pump type: Double-Electric Breast Pump Pump Education: Milk Storage Pumped volume: 25 mL  Interventions Interventions: Breast feeding basics reviewed;Assisted with latch;Breast compression;Adjust position;Support pillows;Expressed milk;DEBP;Education;LC Services brochure  Discharge Discharge Education: Engorgement and breast care;Warning signs for feeding baby;Outpatient recommendation;Outpatient Epic message sent Pump: DEBP;Personal  Consult Status Consult Status: Complete Date: 08/11/22 Follow-up type: In-patient    Stana Bunting M 08/11/2022, 12:38 PM

## 2022-08-15 ENCOUNTER — Telehealth (HOSPITAL_COMMUNITY): Payer: Self-pay | Admitting: *Deleted

## 2022-08-15 NOTE — Telephone Encounter (Signed)
Patient voiced no questions or concerns regarding her health at this time. EPDS=3. Patient voiced no questions or concerns regarding infant at this time. Patient reports infant sleeps in a bassinet on his back. RN reviewed ABCs of safe sleep. Patient verbalized understanding. Patient informed about hospital's virtual postpartum classes and support groups. Declined email information at this time. Erline Levine, RN, 08/15/22, [phone]

## 2022-08-27 ENCOUNTER — Other Ambulatory Visit: Payer: Self-pay

## 2022-08-27 ENCOUNTER — Inpatient Hospital Stay (HOSPITAL_COMMUNITY)
Admission: AD | Admit: 2022-08-27 | Discharge: 2022-08-27 | Disposition: A | Discharge status: Home / Self Care | Payer: Commercial Managed Care - PPO | Attending: Obstetrics and Gynecology | Admitting: Obstetrics and Gynecology

## 2022-08-27 ENCOUNTER — Inpatient Hospital Stay (HOSPITAL_COMMUNITY): Payer: Commercial Managed Care - PPO

## 2022-08-27 ENCOUNTER — Encounter (HOSPITAL_COMMUNITY): Payer: Self-pay | Admitting: Emergency Medicine

## 2022-08-27 DIAGNOSIS — R509 Fever, unspecified: Secondary | ICD-10-CM | POA: Insufficient documentation

## 2022-08-27 DIAGNOSIS — M549 Dorsalgia, unspecified: Secondary | ICD-10-CM | POA: Insufficient documentation

## 2022-08-27 DIAGNOSIS — O909 Complication of the puerperium, unspecified: Secondary | ICD-10-CM | POA: Diagnosis present

## 2022-08-27 DIAGNOSIS — O99893 Other specified diseases and conditions complicating puerperium: Secondary | ICD-10-CM | POA: Diagnosis not present

## 2022-08-27 DIAGNOSIS — O165 Unspecified maternal hypertension, complicating the puerperium: Secondary | ICD-10-CM

## 2022-08-27 DIAGNOSIS — N61 Mastitis without abscess: Secondary | ICD-10-CM

## 2022-08-27 DIAGNOSIS — O9279 Other disorders of lactation: Secondary | ICD-10-CM

## 2022-08-27 LAB — CBC WITH DIFFERENTIAL/PLATELET
Abs Immature Granulocytes: 0.06 10*3/uL (ref 0.00–0.07)
Basophils Absolute: 0.1 10*3/uL (ref 0.0–0.1)
Basophils Relative: 0 %
Eosinophils Absolute: 0.1 10*3/uL (ref 0.0–0.5)
Eosinophils Relative: 1 %
HCT: 36.5 % (ref 36.0–46.0)
Hemoglobin: 12.1 g/dL (ref 12.0–15.0)
Immature Granulocytes: 0 %
Lymphocytes Relative: 9 %
Lymphs Abs: 1.4 10*3/uL (ref 0.7–4.0)
MCH: 29.4 pg (ref 26.0–34.0)
MCHC: 33.2 g/dL (ref 30.0–36.0)
MCV: 88.8 fL (ref 80.0–100.0)
Monocytes Absolute: 0.6 10*3/uL (ref 0.1–1.0)
Monocytes Relative: 4 %
Neutro Abs: 12.7 10*3/uL — ABNORMAL HIGH (ref 1.7–7.7)
Neutrophils Relative %: 86 %
Platelets: 546 10*3/uL — ABNORMAL HIGH (ref 150–400)
RBC: 4.11 MIL/uL (ref 3.87–5.11)
RDW: 13.8 % (ref 11.5–15.5)
WBC: 14.9 10*3/uL — ABNORMAL HIGH (ref 4.0–10.5)
nRBC: 0 % (ref 0.0–0.2)

## 2022-08-27 LAB — URINALYSIS, ROUTINE W REFLEX MICROSCOPIC
Bilirubin Urine: NEGATIVE
Glucose, UA: NEGATIVE mg/dL
Hgb urine dipstick: NEGATIVE
Ketones, ur: 5 mg/dL — AB
Nitrite: NEGATIVE
Protein, ur: 30 mg/dL — AB
Specific Gravity, Urine: 1.021 (ref 1.005–1.030)
pH: 5 (ref 5.0–8.0)

## 2022-08-27 LAB — COMPREHENSIVE METABOLIC PANEL
ALT: 20 U/L (ref 0–44)
AST: 21 U/L (ref 15–41)
Albumin: 3.2 g/dL — ABNORMAL LOW (ref 3.5–5.0)
Alkaline Phosphatase: 73 U/L (ref 38–126)
Anion gap: 13 (ref 5–15)
BUN: 6 mg/dL (ref 6–20)
CO2: 22 mmol/L (ref 22–32)
Calcium: 8.7 mg/dL — ABNORMAL LOW (ref 8.9–10.3)
Chloride: 102 mmol/L (ref 98–111)
Creatinine, Ser: 0.79 mg/dL (ref 0.44–1.00)
GFR, Estimated: >60 mL/min (ref >60)
Glucose, Bld: 91 mg/dL (ref 70–99)
Potassium: 3.5 mmol/L (ref 3.5–5.1)
Sodium: 137 mmol/L (ref 135–145)
Total Bilirubin: 0.9 mg/dL (ref 0.3–1.2)
Total Protein: 6.5 g/dL (ref 6.5–8.1)

## 2022-08-27 LAB — PROTIME-INR
INR: 1.1 (ref 0.8–1.2)
Prothrombin Time: 14.5 seconds (ref 11.4–15.2)

## 2022-08-27 LAB — LACTIC ACID, PLASMA
Lactic Acid, Venous: 0.8 mmol/L (ref 0.5–1.9)
Lactic Acid, Venous: 0.8 mmol/L (ref 0.5–1.9)

## 2022-08-27 LAB — APTT: aPTT: 36 seconds (ref 24–36)

## 2022-08-27 MED ORDER — CEPHALEXIN 500 MG PO CAPS: 500.0000 mg | ORAL_CAPSULE | Freq: Four times a day (QID) | ORAL | 0 refills | Status: AC

## 2022-08-27 MED ORDER — ACETAMINOPHEN 500 MG PO TABS: 1000.0000 mg | ORAL_TABLET | Freq: Four times a day (QID) | ORAL | 0 refills | Status: DC | PRN

## 2022-08-27 MED ORDER — ACETAMINOPHEN 500 MG PO TABS
1000.0000 mg | ORAL_TABLET | Freq: Four times a day (QID) | ORAL | Status: DC | PRN
  Administered 2022-08-27 (×2): 1000 mg via ORAL
  Filled 2022-08-27 (×2): qty 2

## 2022-08-27 MED ORDER — LACTATED RINGERS IV BOLUS (SEPSIS)
1000.0000 mL | Freq: Once | INTRAVENOUS | Status: AC
  Administered 2022-08-27: 1000 mL via INTRAVENOUS

## 2022-08-27 MED ORDER — IBUPROFEN 600 MG PO TABS: 600.0000 mg | ORAL_TABLET | Freq: Four times a day (QID) | ORAL | 1 refills | Status: DC | PRN

## 2022-08-27 MED ORDER — NIFEDIPINE ER OSMOTIC RELEASE 30 MG PO TB24: 30.0000 mg | ORAL_TABLET | Freq: Every day | ORAL | 0 refills | Status: DC

## 2022-08-27 MED ORDER — IBUPROFEN 600 MG PO TABS
600.0000 mg | ORAL_TABLET | Freq: Once | ORAL | Status: AC
  Administered 2022-08-27: 600 mg via ORAL
  Filled 2022-08-27: qty 1

## 2022-08-27 MED ORDER — SODIUM CHLORIDE 0.9 % IV SOLN
2.0000 g | Freq: Once | INTRAVENOUS | Status: AC
  Administered 2022-08-27: 2 g via INTRAVENOUS
  Filled 2022-08-27: qty 12.5

## 2022-08-27 NOTE — Discharge Instructions (Signed)
Take Tylenol 1000 mg and Ibuprofen 600 mg on schedule alternating with one another.  3:00 am Tylenol 6:00 am Ibuprofen  9:00 am Tylenol 12:00 pm Ibuprofen  3:00 pm Tylenol 6:00 pm Ibuprofen  9:00 pm Tylenol 12:00 am Ibuprofen

## 2022-08-27 NOTE — ED Provider Triage Note (Signed)
Emergency Medicine Provider Triage Evaluation Note  Kathryn Moyer , a 37 y.o. female  was evaluated in triage.  Pt complains of:   This is a 37 year old female status post C-section 08/08/2021 presenting to the emergency department due to fever and back pain.  Patient states that postop she has been overall well until yesterday when she started having bilateral flank pain.  This morning she woke up with a fever prompting ED visit.  She also had decreased milk production.  Denies any real pain to the lower abdomen around the surgical scar, not having any purulent drainage or spreading redness.  Denies any nausea, vomiting, dysuria, hematuria, pelvic pain.  History of 2 other C-sections, followed by Paula Compton with OB/GYN oncology .  Review of Systems  Per HPI  Physical Exam  BP 136/76 (BP Location: Right Arm)   Pulse 100   Temp 100.1 F (37.8 C) (Oral)   Resp 16   Ht 5\' 7"  (1.702 m)   Wt 115 kg   SpO2 98%   BMI 39.71 kg/m  Gen:   Awake, no distress   Resp:  Normal effort  MSK:   Moves extremities without difficulty  Other:  Febrile, slightly tachycardic with a rate of 100.  C-section scar looks well without any surrounding erythema or purulence expressed with palpation  Medical Decision Making  Medically screening exam initiated at 1:07 PM.  Appropriate orders placed.  Kathryn Moyer was informed that the remainder of the evaluation will be completed by another provider, this initial triage assessment does not replace that evaluation, and the importance of remaining in the ED until their evaluation is complete.  I called MAU, they will accept the patient.  Spoke with APP [place], [place] 08/27/22 1309

## 2022-08-27 NOTE — Progress Notes (Signed)
[facility] following for sepsis protocol. 

## 2022-08-27 NOTE — ED Triage Notes (Signed)
Pt reports fever today, decreased milk production, tender breast, weakness. Pt had c-section 1/2 here. Pain to back since yesterday.

## 2022-08-27 NOTE — MAU Provider Note (Signed)
Chief Complaint: Fever, Back Pain, Headache, and Breast Pain   Event Date/Time   First Provider Initiated Contact with Patient 08/27/22 1612     SUBJECTIVE HPI: Kathryn Moyer is a 37 y.o. [id number] at 2 weeks 5 days postpartum planned repeat C/S who presents to [facility] reporting fever, body aches, left breast pain and back pain since since this morning. Feels like she can't empty her breast with pump.   Location: Left breast, low back Quality: sore Severity: 9/10 on pain scale Duration: < 24 hours Context: post-op C/S, breast feeding Timing: constant Modifying factors: None. Hasn't tried anything Associated signs and symptoms: Pos for fever, chills, body aches, decrease milk production. Neg for incision problems. Abd pain, urinary complaints, GI complaints, respiratory complaints, sick contacts. Scant vaginal bleeding.   Past Medical History:  Diagnosis Date   Hx of varicella    Medical history non-contributory    OB History  Gravida Para Term Preterm AB Living  3 3 3     3   SAB IAB Ectopic Multiple Live Births        0 3    # Outcome Date GA Lbr Len/2nd Weight Sex Delivery Anes PTL Lv  3 Term 08/08/22 [redacted]w[redacted]d  3260 g M CS-LTranv Spinal  LIV  2 Term 09/07/17 [redacted]w[redacted]d  3755 g M CS-LTranv Spinal  LIV  1 Term 04/02/14 [redacted]w[redacted]d  3285 g M CS-LTranv EPI  LIV   Past Surgical History:  Procedure Laterality Date   ANTERIOR CRUCIATE LIGAMENT REPAIR     R klnee   CESAREAN SECTION N/A 04/02/2014   Procedure: CESAREAN SECTION;  Surgeon: 04/04/2014, MD;  Location: [facility]S;  Service: Obstetrics;  Laterality: N/A;   CESAREAN SECTION N/A 09/07/2017   Procedure: REPEAT CESAREAN SECTION;  Surgeon: 11/05/2017, MD;  Location: [facility] BIRTHING SUIT[facility];  Service: Obstetrics;  Laterality: N/A;  Tracey RNFA   CESAREAN SECTION N/A 08/08/2022   Procedure: CESAREAN SECTION;  Surgeon: 10/07/2022, MD;  Location: [facility] LD ORS;  Service: Obstetrics;  Laterality: N/A;   Social History    Socioeconomic History   Marital status: Single    Spouse name: Not on file   Number of children: Not on file   Years of education: Not on file   Highest education level: Not on file  Occupational History   Not on file  Tobacco Use   Smoking status: Every Day    Packs/day: 0.00    Years: 4.00    Total pack years: 0.00    Types: Cigarettes    Last attempt to quit: 08/31/2013    Years since quitting: 8.9   Smokeless tobacco: Never  Vaping Use   Vaping Use: Never used  Substance and Sexual Activity   Alcohol use: Yes    Comment: She drinks spirits on the weekend    Drug use: No   Sexual activity: Yes    Birth control/protection: None  Other Topics Concern   Not on file  Social History Narrative   Not on file   Social Determinants of Health   Financial Resource Strain: Not on file  Food Insecurity: Not on file  Transportation Needs: Not on file  Physical Activity: Not on file  Stress: Not on file  Social Connections: Not on file  Intimate Partner Violence: Not on file   Family History  Problem Relation Age of Onset   Hearing loss Paternal Grandmother        deaf   Hearing loss Paternal Grandfather  deaf   Diabetes Paternal Grandfather    Hypertension Mother    Hypertension Father    Cancer Maternal Grandmother        lung   Diabetes Maternal Grandfather    Autism Son    No current facility-administered medications on file prior to encounter.   Current Outpatient Medications on File Prior to Encounter  Medication Sig Dispense Refill   cyclobenzaprine (FLEXERIL) 10 MG tablet Take 1 tablet (10 mg total) by mouth 3 (three) times daily as needed for muscle spasms. 30 tablet 0   Prenatal Vit-Fe Fumarate-FA (PRENATAL MULTIVITAMIN) TABS tablet Take 1 tablet by mouth daily at 12 noon.     Allergies  Allergen Reactions   Other Hives and Rash    *foods with high acidity*    I have reviewed patient's Past Medical Hx, Surgical Hx, Family Hx, Social Hx,  medications and allergies.   Review of Systems  Constitutional:  Positive for chills and fever. Negative for appetite change.  HENT:  Negative for congestion, ear pain, rhinorrhea and sore throat.   Respiratory:  Negative for cough.   Gastrointestinal:  Negative for abdominal pain, diarrhea, nausea and vomiting.  Genitourinary:  Positive for flank pain (questionable) and vaginal bleeding (scant). Negative for dysuria, frequency, hematuria, pelvic pain and urgency.  Musculoskeletal:  Positive for back pain and myalgias. Negative for neck pain and neck stiffness.  Neurological:  Negative for headaches.    OBJECTIVE Patient Vitals for the past 24 hrs:  BP Temp Temp src Pulse Resp SpO2 Height Weight  08/27/22 1856 -- 99.7 F (37.6 C) Oral -- -- -- -- --  08/27/22 1558 129/73 100.3 F (37.9 C) Oral (!) 102 18 95 % -- --  08/27/22 1440 134/80 (!) 102.3 F (39.1 C) Oral (!) 108 16 -- -- --  08/27/22 1439 -- -- -- -- -- 99 % -- --  08/27/22 1300 -- -- -- -- -- -- 5\' 7"  (1.702 m) 115 kg  08/27/22 1258 136/76 100.1 F (37.8 C) Oral 100 16 98 % -- --   Constitutional: Well-developed, well-nourished female in mild distress. Ill-appearing.  Cardiovascular: Tachycardic Respiratory: normal rate and effort. CTAB. No cough GI: Abd soft, non-tender, uterus 2/SP, NT. Incision healing well, no drainage, warmth, tenderness, drainage or swelling.  MS: Generalized muscular tenderness.  Neurologic: Alert and oriented x 4.  GU: Neg CVAT.  SPECULUM EXAM: Deferred  LAB RESULTS Results for orders placed or performed during the hospital encounter of 08/27/22 (from the past 24 hour(s))  Urinalysis, Routine w reflex microscopic Urine, Clean Catch     Status: Abnormal   Collection Time: 08/27/22  1:45 PM  Result Value Ref Range   Color, Urine YELLOW YELLOW   APPearance HAZY (A) CLEAR   Specific Gravity, Urine 1.021 1.005 - 1.030   pH 5.0 5.0 - 8.0   Glucose, UA NEGATIVE NEGATIVE mg/dL   Hgb urine  dipstick NEGATIVE NEGATIVE   Bilirubin Urine NEGATIVE NEGATIVE   Ketones, ur 5 (A) NEGATIVE mg/dL   Protein, ur 30 (A) NEGATIVE mg/dL   Nitrite NEGATIVE NEGATIVE   Leukocytes,Ua SMALL (A) NEGATIVE   RBC / HPF 0-5 0 - 5 RBC/hpf   WBC, UA 6-10 0 - 5 WBC/hpf   Bacteria, UA RARE (A) NONE SEEN   Squamous Epithelial / HPF 0-5 0 - 5 /HPF   Mucus PRESENT   CBC with Differential/Platelet     Status: Abnormal   Collection Time: 08/27/22  2:07 PM  Result Value Ref  Range   WBC 14.9 (H) 4.0 - 10.5 K/uL   RBC 4.11 3.87 - 5.11 MIL/uL   Hemoglobin 12.1 12.0 - 15.0 g/dL   HCT 36.5 36.0 - 46.0 %   MCV 88.8 80.0 - 100.0 fL   MCH 29.4 26.0 - 34.0 pg   MCHC 33.2 30.0 - 36.0 g/dL   RDW 13.8 11.5 - 15.5 %   Platelets 546 (H) 150 - 400 K/uL   nRBC 0.0 0.0 - 0.2 %   Neutrophils Relative % 86 %   Neutro Abs 12.7 (H) 1.7 - 7.7 K/uL   Lymphocytes Relative 9 %   Lymphs Abs 1.4 0.7 - 4.0 K/uL   Monocytes Relative 4 %   Monocytes Absolute 0.6 0.1 - 1.0 K/uL   Eosinophils Relative 1 %   Eosinophils Absolute 0.1 0.0 - 0.5 K/uL   Basophils Relative 0 %   Basophils Absolute 0.1 0.0 - 0.1 K/uL   Immature Granulocytes 0 %   Abs Immature Granulocytes 0.06 0.00 - 0.07 K/uL  Comprehensive metabolic panel     Status: Abnormal   Collection Time: 08/27/22  4:48 PM  Result Value Ref Range   Sodium 137 135 - 145 mmol/L   Potassium 3.5 3.5 - 5.1 mmol/L   Chloride 102 98 - 111 mmol/L   CO2 22 22 - 32 mmol/L   Glucose, Bld 91 70 - 99 mg/dL   BUN 6 6 - 20 mg/dL   Creatinine, Ser 0.79 0.44 - 1.00 mg/dL   Calcium 8.7 (L) 8.9 - 10.3 mg/dL   Total Protein 6.5 6.5 - 8.1 g/dL   Albumin 3.2 (L) 3.5 - 5.0 g/dL   AST 21 15 - 41 U/L   ALT 20 0 - 44 U/L   Alkaline Phosphatase 73 38 - 126 U/L   Total Bilirubin 0.9 0.3 - 1.2 mg/dL   GFR, Estimated >60 >60 mL/min   Anion gap 13 5 - 15  Lactic acid, plasma     Status: None   Collection Time: 08/27/22  4:48 PM  Result Value Ref Range   Lactic Acid, Venous 0.8 0.5 - 1.9  mmol/L  Protime-INR     Status: None   Collection Time: 08/27/22  4:48 PM  Result Value Ref Range   Prothrombin Time 14.5 11.4 - 15.2 seconds   INR 1.1 0.8 - 1.2  APTT     Status: None   Collection Time: 08/27/22  4:48 PM  Result Value Ref Range   aPTT 36 24 - 36 seconds  Lactic acid, plasma     Status: None   Collection Time: 08/27/22  6:09 PM  Result Value Ref Range   Lactic Acid, Venous 0.8 0.5 - 1.9 mmol/L    IMAGING DG Chest Port 1 View  Result Date: 08/27/2022 CLINICAL DATA:  Fever and back pain. EXAM: PORTABLE CHEST 1 VIEW COMPARISON:  12/30/2010 FINDINGS: 1638 hours. Low volume film. Cardiopericardial silhouette is at upper limits of normal for size. There is pulmonary vascular congestion without overt pulmonary edema. The lungs are clear without focal pneumonia, edema, pneumothorax or pleural effusion. The visualized bony structures of the thorax are unremarkable. IMPRESSION: Low volume film with pulmonary vascular congestion. Electronically Signed   By: Misty Stanley M.D.   On: 08/27/2022 16:55    MAU COURSE Orders Placed This Encounter  Procedures   Respiratory (~20 pathogens) panel by PCR   Culture, blood (x 2)   Urine Culture   DG Chest [facility] 1 [address]  CBC with Differential/Platelet   Urinalysis, Routine w reflex microscopic Urine, Clean Catch   Comprehensive metabolic panel   Lactic acid, plasma   Protime-INR   APTT   Refer to Sidebar Report: Sepsis Bundle ED/IP   Apply Sepsis Care Plan   If lactate (lactic acid) >2, verify repeat lactic acid order has been placed to be drawn   Document vital signs within 1-hour of fluid bolus completion and notify provider of bolus completion   Vital signs   Vital signs   Assess and Document Glasgow Coma Scale   Breast pump to bed   Code Sepsis activation.  This occurs automatically when order is signed and prioritizes pharmacy, lab, and radiology services for STAT collections and interventions.  If [facility] downtime, call [facility]  [phone]) to activate Code Sepsis.   ceFEPime (MAXIPIME) per pharmacy consult            Consult to lactation   Droplet precaution   EKG 12-Lead   Discharge patient   Meds ordered this encounter  Medications   acetaminophen (TYLENOL) tablet 1,000 mg   AND Linked Order Group    lactated ringers bolus 1,000 mL     Order Specific Question:   BMI >/= 35, Ideal Body Weight basis in kg for 30 mL/kg bolus delivery     Answer:   61.6    lactated ringers bolus 1,000 mL     Order Specific Question:   BMI >/= 35, Ideal Body Weight basis in kg for 30 mL/kg bolus delivery     Answer:   61.6   ceFEPIme (MAXIPIME) 2 g in sodium chloride 0.9 % 100 mL IVPB    Order Specific Question:   Antibiotic Indication:    Answer:   UTI   ibuprofen (ADVIL) tablet 600 mg   acetaminophen (TYLENOL) 500 MG tablet    Sig: Take 2 tablets (1,000 mg total) by mouth every 6 (six) hours as needed for fever or headache.    Dispense:  30 tablet    Refill:  0    Order Specific Question:   Supervising Provider    Answer:   Reva Bores [[id number]]   cephALEXin (KEFLEX) 500 MG capsule    Sig: Take 1 capsule (500 mg total) by mouth 4 (four) times daily for 7 days.    Dispense:  28 capsule    Refill:  0    Order Specific Question:   Supervising Provider    Answer:   Reva Bores [[id number]]   ibuprofen (ADVIL) 600 MG tablet    Sig: Take 1 tablet (600 mg total) by mouth every 6 (six) hours as needed for moderate pain or cramping.    Dispense:  40 tablet    Refill:  1    Order Specific Question:   Supervising Provider    Answer:   Samara Snide   Nurses had great difficulty starting IV which delayed administration of IV fluids and ABX.   Pt reexamined after Tylenol due to it being unclear if she had CVAT. On repeat exam she did not have CVAT.   Discussed Hx, labs, exam w/ Dr. Shawnie Pons. Agrees w/ POC.     MDM - Fever of unclear etiology. Suspect evolving mastitis. Respiratory panel pending. No evidence of sepsis.  Will send pt home on Keflex witch will also cover UTI/pyelo if culture comes back positive.    ASSESSMENT 1. Fever of unknown origin   2. Breast engorgement, obstetric, postpartum condition   3.  Mastitis, left, acute     PLAN Discharge home in stable condition. Mastitis and Pyelo precautions Take Tylenol and IBU on schedule Push fluids F/U with [facility] in 2-3 days Empty breast thoroughly and on schedule.  Consider LC.   Follow-up Information     [facility] Follow up.   Why: Will call to schedule follow-appointment in 2-3 days Contact information: [address]  [address] [place] [address] [phone]         [facility] Follow up.   Specialty: Obstetrics and Gynecology Why: As needed if symptoms worsen Contact information: [address] [id number] mc [place] [address] [phone]               Allergies as of 08/27/2022       Reactions   Other Hives, Rash   *foods with high acidity*        Medication List     TAKE these medications    acetaminophen 500 MG tablet Commonly known as: TYLENOL Take 2 tablets (1,000 mg total) by mouth every 6 (six) hours as needed for fever or headache.   cephALEXin 500 MG capsule Commonly known as: KEFLEX Take 1 capsule (500 mg total) by mouth 4 (four) times daily for 7 days.   cyclobenzaprine 10 MG tablet Commonly known as: FLEXERIL Take 1 tablet (10 mg total) by mouth 3 (three) times daily as needed for muscle spasms.   ibuprofen 600 MG tablet Commonly known as: ADVIL Take 1 tablet (600 mg total) by mouth every 6 (six) hours as needed for moderate pain or cramping.   prenatal multivitamin Tabs tablet Take 1 tablet by mouth daily at 12 noon.         Katrinka Blazing, [place], CNM 08/27/2022  9:00 PM

## 2022-08-27 NOTE — MAU Note (Signed)
.  Kathryn Moyer is a 37 y.o. at Unknown here in MAU reporting: generalized body aches, left breast pain, and fever that all started today  Onset of complaint: 1/21 Pain score: 9 Vitals:   08/27/22 1439 08/27/22 1440  BP:  134/80  Pulse:  (!) 108  Resp:  16  Temp:  (!) 102.3 F (39.1 C)  SpO2: 99%

## 2022-08-28 LAB — URINE CULTURE

## 2022-08-29 ENCOUNTER — Encounter: Payer: Self-pay | Admitting: Advanced Practice Midwife

## 2022-09-01 LAB — CULTURE, BLOOD (ROUTINE X 2)
Culture: NO GROWTH
Culture: NO GROWTH
Special Requests: ADEQUATE

## 2022-09-13 ENCOUNTER — Other Ambulatory Visit: Payer: Self-pay | Admitting: Obstetrics and Gynecology

## 2022-09-13 DIAGNOSIS — K521 Toxic gastroenteritis and colitis: Secondary | ICD-10-CM

## 2022-09-13 NOTE — Progress Notes (Signed)
Erroneous encounter opened for Dr. Marvel Plan.

## 2023-09-06 ENCOUNTER — Encounter: Payer: Self-pay | Admitting: Registered Nurse

## 2023-09-28 ENCOUNTER — Encounter: Payer: Self-pay | Admitting: Physician Assistant

## 2023-11-09 ENCOUNTER — Ambulatory Visit: Payer: Commercial Managed Care - PPO | Admitting: Physician Assistant

## 2023-11-09 ENCOUNTER — Telehealth: Payer: Self-pay | Admitting: Physician Assistant

## 2023-11-09 NOTE — Telephone Encounter (Signed)
 PT has an appointment scheduled for today at 940am. I advised of our late policy.

## 2023-11-09 NOTE — Progress Notes (Deleted)
 11/09/2023 Kathryn Moyer [id number] 16-Jul-1986  Referring provider: Cyril Mourning, FNP Primary GI doctor: {[id number]}  ASSESSMENT AND PLAN:   Assessment and Plan          Loose stools with history of C. Difficile C. difficile January 2024 after C-section and antibiotics for fever of unknown origin Hyperthyroidism on labs 06/21/2023 with PCP No anemia on labs 06/21/2023  Nausea  Status post C-section 08/11/2022 Third child     No care team member to display  HISTORY OF PRESENT ILLNESS: 38 y.o. female with a past medical history listed below presents as a new patient for evaluation with history of C. difficile, loose stools and nausea.  Discussed the use of AI scribe software for clinical note transcription with the patient, who gave verbal consent to proceed.  History of Present Illness            She  reports that she has been smoking cigarettes. She has never used smokeless tobacco. She reports current alcohol use. She reports that she does not use drugs.  RELEVANT GI HISTORY, IMAGING AND LABS: Results          CBC    Component Value Date/Time   WBC 14.9 (H) 08/27/2022 1407   RBC 4.11 08/27/2022 1407   HGB 12.1 08/27/2022 1407   HGB 11.9 06/30/2020 1142   HCT 36.5 08/27/2022 1407   HCT 36.2 06/30/2020 1142   PLT 546 (H) 08/27/2022 1407   PLT 424 06/30/2020 1142   MCV 88.8 08/27/2022 1407   MCV 96 06/30/2020 1142   MCH 29.4 08/27/2022 1407   MCHC 33.2 08/27/2022 1407   RDW 13.8 08/27/2022 1407   RDW 13.4 06/30/2020 1142   LYMPHSABS 1.4 08/27/2022 1407   LYMPHSABS 2.8 05/23/2018 1029   MONOABS 0.6 08/27/2022 1407   EOSABS 0.1 08/27/2022 1407   EOSABS 0.4 05/23/2018 1029   BASOSABS 0.1 08/27/2022 1407   BASOSABS 0.1 05/23/2018 1029   No results for input(s): "HGB" in the last 8760 hours.  CMP     Component Value Date/Time   NA 137 08/27/2022 1648   NA 142 06/30/2020 1142   K 3.5 08/27/2022 1648   CL 102 08/27/2022 1648   CO2 22  08/27/2022 1648   GLUCOSE 91 08/27/2022 1648   BUN 6 08/27/2022 1648   BUN 11 06/30/2020 1142   CREATININE 0.79 08/27/2022 1648   CALCIUM 8.7 (L) 08/27/2022 1648   PROT 6.5 08/27/2022 1648   PROT 6.8 06/30/2020 1142   ALBUMIN 3.2 (L) 08/27/2022 1648   ALBUMIN 4.4 06/30/2020 1142   AST 21 08/27/2022 1648   ALT 20 08/27/2022 1648   ALKPHOS 73 08/27/2022 1648   BILITOT 0.9 08/27/2022 1648   BILITOT 0.5 06/30/2020 1142   GFRNONAA >60 08/27/2022 1648   GFRAA 93 06/30/2020 1142      Latest Ref Rng & Units 08/27/2022    4:48 PM 06/30/2020   11:42 AM 08/17/2013    7:25 PM  Hepatic Function  Total Protein 6.5 - 8.1 g/dL 6.5  6.8  7.5   Albumin 3.5 - 5.0 g/dL 3.2  4.4  4.0   AST 15 - 41 U/L 21  13  11    ALT 0 - 44 U/L 20  14  10    Alk Phosphatase 38 - 126 U/L 73  52  57   Total Bilirubin 0.3 - 1.2 mg/dL 0.9  0.5  <7.8       Current Medications:  Current Outpatient Medications (Cardiovascular):    NIFEdipine (PROCARDIA-XL/NIFEDICAL-XL) 30 MG 24 hr tablet, Take 1 tablet (30 mg total) by mouth daily.   Current Outpatient Medications (Analgesics):    acetaminophen (TYLENOL) 500 MG tablet, Take 2 tablets (1,000 mg total) by mouth every 6 (six) hours as needed for fever or headache.   ibuprofen (ADVIL) 600 MG tablet, Take 1 tablet (600 mg total) by mouth every 6 (six) hours as needed for moderate pain or cramping.   Current Outpatient Medications (Other):    cyclobenzaprine (FLEXERIL) 10 MG tablet, Take 1 tablet (10 mg total) by mouth 3 (three) times daily as needed for muscle spasms.   Prenatal Vit-Fe Fumarate-FA (PRENATAL MULTIVITAMIN) TABS tablet, Take 1 tablet by mouth daily at 12 noon.  Medical History:  Past Medical History:  Diagnosis Date   Hx of varicella    Medical history non-contributory    Allergies:  Allergies  Allergen Reactions   Other Hives and Rash    *foods with high acidity*     Surgical History:  She  has a past surgical history that includes  Anterior cruciate ligament repair; Cesarean section (N/A, 04/02/2014); Cesarean section (N/A, 09/07/2017); and Cesarean section (N/A, 08/08/2022). Family History:  Her family history includes Autism in her son; Cancer in her maternal grandmother; Diabetes in her maternal grandfather and paternal grandfather; Hearing loss in her paternal grandfather and paternal grandmother; Hypertension in her father and mother.  REVIEW OF SYSTEMS  : All other systems reviewed and negative except where noted in the History of Present Illness.  PHYSICAL EXAM: There were no vitals taken for this visit. Physical Exam          Doree Albee, PA-C 7:50 AM

## 2023-12-05 ENCOUNTER — Other Ambulatory Visit: Payer: Self-pay

## 2023-12-05 ENCOUNTER — Ambulatory Visit (HOSPITAL_COMMUNITY)
Admission: EM | Admit: 2023-12-05 | Discharge: 2023-12-05 | Disposition: A | Discharge status: Home / Self Care | Payer: Self-pay | Attending: Emergency Medicine | Admitting: Emergency Medicine

## 2023-12-05 ENCOUNTER — Encounter (HOSPITAL_COMMUNITY): Payer: Self-pay | Admitting: *Deleted

## 2023-12-05 DIAGNOSIS — Z3202 Encounter for pregnancy test, result negative: Secondary | ICD-10-CM

## 2023-12-05 DIAGNOSIS — R197 Diarrhea, unspecified: Secondary | ICD-10-CM

## 2023-12-05 LAB — CBC
HCT: 34.4 % — ABNORMAL LOW (ref 36.0–46.0)
Hemoglobin: 11.1 g/dL — ABNORMAL LOW (ref 12.0–15.0)
MCH: 28.7 pg (ref 26.0–34.0)
MCHC: 32.3 g/dL (ref 30.0–36.0)
MCV: 88.9 fL (ref 80.0–100.0)
Platelets: 482 10*3/uL — ABNORMAL HIGH (ref 150–400)
RBC: 3.87 MIL/uL (ref 3.87–5.11)
RDW: 15.4 % (ref 11.5–15.5)
WBC: 5.8 10*3/uL (ref 4.0–10.5)
nRBC: 0 % (ref 0.0–0.2)

## 2023-12-05 LAB — COMPREHENSIVE METABOLIC PANEL WITH GFR
ALT: 22 U/L (ref 0–44)
AST: 27 U/L (ref 15–41)
Albumin: 3.3 g/dL — ABNORMAL LOW (ref 3.5–5.0)
Alkaline Phosphatase: 47 U/L (ref 38–126)
Anion gap: 9 (ref 5–15)
BUN: 5 mg/dL — ABNORMAL LOW (ref 6–20)
CO2: 22 mmol/L (ref 22–32)
Calcium: 8.4 mg/dL — ABNORMAL LOW (ref 8.9–10.3)
Chloride: 108 mmol/L (ref 98–111)
Creatinine, Ser: 0.7 mg/dL (ref 0.44–1.00)
GFR, Estimated: >60 mL/min (ref >60)
Glucose, Bld: 91 mg/dL (ref 70–99)
Potassium: 3.6 mmol/L (ref 3.5–5.1)
Sodium: 139 mmol/L (ref 135–145)
Total Bilirubin: 0.4 mg/dL (ref 0.0–1.2)
Total Protein: 6.2 g/dL — ABNORMAL LOW (ref 6.5–8.1)

## 2023-12-05 LAB — POCT URINE PREGNANCY: Preg Test, Ur: NEGATIVE

## 2023-12-05 LAB — POCT URINALYSIS DIP (MANUAL ENTRY)
Bilirubin, UA: NEGATIVE
Glucose, UA: NEGATIVE mg/dL
Ketones, POC UA: NEGATIVE mg/dL
Leukocytes, UA: NEGATIVE
Nitrite, UA: NEGATIVE
Protein Ur, POC: NEGATIVE mg/dL
Spec Grav, UA: 1.02 (ref 1.010–1.025)
Urobilinogen, UA: 0.2 U/dL
pH, UA: 5.5 (ref 5.0–8.0)

## 2023-12-05 LAB — LIPASE, BLOOD: Lipase: 35 U/L (ref 11–51)

## 2023-12-05 MED ORDER — LOPERAMIDE HCL 2 MG PO CAPS: 2.0000 mg | ORAL_CAPSULE | Freq: Four times a day (QID) | ORAL | 0 refills | Status: AC | PRN

## 2023-12-05 NOTE — ED Notes (Signed)
 Pt provided with supplise to collect stool at home. Pt verbalizes understanding on how to collect stool sample.

## 2023-12-05 NOTE — ED Triage Notes (Signed)
 PT DOB and Full Name confirmed

## 2023-12-05 NOTE — Discharge Instructions (Signed)
 Take Imodium 4 times daily as needed for diarrhea. Be sure to return here with the stool sample.  As discussed we close 8 PM today that you can bring it anytime prior to that and we are open from 8 AM to 8 PM tomorrow and you can bring it at that time as well. Make sure you are staying hydrated. We have drawn some lab work today and those results should return later today and you will receive a phone call if any of these results are concerning and advise treatment at that time. Keep your appointment with GI in June. Follow-up with primary care provider or return here if your symptoms persist. If you develop worsening abdominal pain, persistent uncontrollable diarrhea, excessive vomiting, high fevers, or significant blood in your stool please seek immediate medical treatment in the emergency department.

## 2023-12-05 NOTE — ED Provider Notes (Signed)
 [facility]    CSN: [id number] Arrival date & time: 12/05/23  1138      History   Chief Complaint Chief Complaint  Patient presents with   Abdominal Pain   Emesis   Diarrhea    HPI Kathryn Moyer is a 38 y.o. female.   Patient presents with abdominal pain, diarrhea, fatigue, and intermittent headache that began on 4/28.  Patient states that she did have 1 episode of vomiting yesterday.   Patient states that she has had somewhere between 10-15 episodes of diarrhea in the last 24 hours.  Patient states that she mainly has abdominal pain and cramping when she has the urge to have diarrhea or while having diarrhea.  LMP 11/18/2023.  Denies fever, blood in stool, urinary symptoms, vaginal bleeding, vaginal discharge.  Patient reports history of C. difficile infection about a year ago and is worried she may have this again.  Patient denies any recent antibiotic use.  Patient states that she was scheduled to see [facility] earlier this month based on referral from her primary care provider, but missed her appointment and rescheduled it for June.  The history is provided by the patient and medical records.  Abdominal Pain Associated symptoms: diarrhea and vomiting   Emesis Associated symptoms: abdominal pain and diarrhea   Diarrhea Associated symptoms: abdominal pain and vomiting     Past Medical History:  Diagnosis Date   Hx of varicella    Medical history non-contributory     Patient Active Problem List   Diagnosis Date Noted   Allergic reaction 08/08/2018   Right hand pain 08/08/2018   Vitamin D  deficiency 07/09/2018   Fatigue 07/09/2018   S/P repeat low transverse C-section 09/07/2017   S/P C-section 04/02/2014   Normal pregnancy 03/31/2014    Past Surgical History:  Procedure Laterality Date   ANTERIOR CRUCIATE LIGAMENT REPAIR     R klnee   CESAREAN SECTION N/A 04/02/2014   Procedure: CESAREAN SECTION;  Surgeon: Adrianna Albee, MD;  Location: [facility];  Service: Obstetrics;  Laterality: N/A;   CESAREAN SECTION N/A 09/07/2017   Procedure: REPEAT CESAREAN SECTION;  Surgeon: Rogene Claude, MD;  Location: [facility];  Service: Obstetrics;  Laterality: N/A;  Tracey RNFA   CESAREAN SECTION N/A 08/08/2022   Procedure: CESAREAN SECTION;  Surgeon: Rogene Claude, MD;  Location: [facility] LD ORS;  Service: Obstetrics;  Laterality: N/A;    OB History     Gravida  3   Para  3   Term  3   Preterm      AB      Living  3      SAB      IAB      Ectopic      Multiple  0   Live Births  3            Home Medications    Prior to Admission medications   Medication Sig Start Date End Date Taking? Authorizing Provider  loperamide (IMODIUM) 2 MG capsule Take 1 capsule (2 mg total) by mouth 4 (four) times daily as needed for diarrhea or loose stools. 12/05/23  Yes Karon Packer, NP    Family History Family History  Problem Relation Age of Onset   Hearing loss Paternal Grandmother        deaf   Hearing loss Paternal Grandfather        deaf   Diabetes Paternal Grandfather    Hypertension Mother  Hypertension Father    Cancer Maternal Grandmother        lung   Diabetes Maternal Grandfather    Autism Son     Social History Social History   Tobacco Use   Smoking status: Every Day    Current packs/day: 0.00    Types: Cigarettes    Last attempt to quit: 08/31/2009    Years since quitting: 14.2   Smokeless tobacco: Never  Vaping Use   Vaping status: Never Used  Substance Use Topics   Alcohol use: Yes    Comment: She drinks spirits on the weekend    Drug use: No     Allergies   Other   Review of Systems Review of Systems  Gastrointestinal:  Positive for abdominal pain, diarrhea and vomiting.   Per HPI  Physical Exam Triage Vital Signs ED Triage Vitals  Encounter Vitals Group     BP 12/05/23 1242 112/76     Systolic BP Percentile --      Diastolic BP Percentile --      Pulse Rate 12/05/23  1242 81     Resp 12/05/23 1242 18     Temp 12/05/23 1242 98.7 F (37.1 C)     Temp src --      SpO2 12/05/23 1242 94 %     Weight --      Height --      Head Circumference --      Peak Flow --      Pain Score 12/05/23 1239 6     Pain Loc --      Pain Education --      Exclude from Growth Chart --    No data found.  Updated Vital Signs BP 112/76   Pulse 81   Temp 98.7 F (37.1 C)   Resp 18   LMP 11/18/2023   SpO2 94%   Visual Acuity Right Eye Distance:   Left Eye Distance:   Bilateral Distance:    Right Eye Near:   Left Eye Near:    Bilateral Near:     Physical Exam Vitals and nursing note reviewed.  Constitutional:      General: She is awake. She is not in acute distress.    Appearance: Normal appearance. She is well-developed and well-groomed. She is not ill-appearing.  Abdominal:     General: Abdomen is protuberant. Bowel sounds are normal. There is no distension.     Palpations: Abdomen is soft.     Tenderness: There is abdominal tenderness in the epigastric area and left upper quadrant. There is no guarding or rebound. Negative signs include Murphy's sign.  Neurological:     Mental Status: She is alert.  Psychiatric:        Behavior: Behavior is cooperative.      UC Treatments / Results  Labs (all labs ordered are listed, but only abnormal results are displayed) Labs Reviewed  POCT URINALYSIS DIP (MANUAL ENTRY) - Abnormal; Notable for the following components:      Result Value   Blood, UA trace-lysed (*)    All other components within normal limits  LIPASE, BLOOD  CBC  COMPREHENSIVE METABOLIC PANEL WITH GFR  POCT URINE PREGNANCY    EKG   Radiology No results found.  Procedures Procedures (including critical care time)  Medications Ordered in UC Medications - No data to display  Initial Impression / Assessment and Plan / UC Course  I have reviewed the triage vital signs and the nursing notes.  Pertinent labs & imaging results that  were available during my care of the patient were reviewed by me and considered in my medical decision making (see chart for details).     Patient is well-appearing.  Vitals are stable.  Upon assessment patient has mild tenderness to epigastric region and left upper quadrant, without guarding, rebound tenderness, or Murphy's sign.  Urinalysis unremarkable and urine pregnancy negative.  Ordered CBC, CMP, and lipase for further evaluation of abdominal pain and diarrhea as well as to rule out electrolyte imbalance due to the amount of episodes of diarrhea.  Ordered stool sample, but patient is unable to provide 1 today.  Ordered future orders for stool sample and told to return here with stool sample.    Prescribed Imodium as needed for diarrhea.  Discussed importance of hydration.  Recommended keeping follow-up appointment with GI.  Discussed follow-up, return, and strict ER precautions. Final Clinical Impressions(s) / UC Diagnoses   Final diagnoses:  Diarrhea of presumed infectious origin     Discharge Instructions      Take Imodium 4 times daily as needed for diarrhea. Be sure to return here with the stool sample.  As discussed we close 8 PM today that you can bring it anytime prior to that and we are open from 8 AM to 8 PM tomorrow and you can bring it at that time as well. Make sure you are staying hydrated. We have drawn some lab work today and those results should return later today and you will receive a phone call if any of these results are concerning and advise treatment at that time. Keep your appointment with GI in June. Follow-up with primary care provider or return here if your symptoms persist. If you develop worsening abdominal pain, persistent uncontrollable diarrhea, excessive vomiting, high fevers, or significant blood in your stool please seek immediate medical treatment in the emergency department.     ED Prescriptions     Medication Sig Dispense Auth. Provider    loperamide (IMODIUM) 2 MG capsule Take 1 capsule (2 mg total) by mouth 4 (four) times daily as needed for diarrhea or loose stools. 12 capsule Levora Reas A, NP      PDMP not reviewed this encounter.   Levora Reas A, NP 12/05/23 [id number]

## 2023-12-05 NOTE — ED Triage Notes (Signed)
 PT reports having ABD pain, vomiting and diarrhea. Pt feels fatigued.

## 2023-12-06 ENCOUNTER — Telehealth (HOSPITAL_COMMUNITY): Payer: Self-pay | Admitting: Emergency Medicine

## 2023-12-06 LAB — C DIFFICILE QUICK SCREEN W PCR REFLEX
C Diff antigen: NEGATIVE
C Diff interpretation: NOT DETECTED
C Diff toxin: NEGATIVE

## 2023-12-06 NOTE — Telephone Encounter (Signed)
 Marchelle Sessions, Rad Tech/Lab requested assistance with release of requisition for a stool specimen that patient has brought to department today.    Released order and produced requisition for stool specimen transport to hospital lab

## 2023-12-07 LAB — GASTROINTESTINAL PANEL BY PCR, STOOL (REPLACES STOOL CULTURE)

## 2024-01-08 NOTE — Progress Notes (Unsigned)
 01/09/2024 Kathryn Moyer [id number] 31-Aug-1985  Referring provider: Artie Laster, FNP Primary GI doctor: Dr. Rosaline Moyer  ASSESSMENT AND PLAN:  Loose stools with history of C. Difficile She had positive Cdiff after birth of her child, had to have second treatment, uncertain what medications were used 12/06/2023 GI pathogen panel positive for astrovirus negative C. 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 Differential diagnosis includes inflammatory bowel disease and celiac disease. - Initiate low FODMAP diet. - Recommend avoidance of milk products. - Prescribe Florastor (probiotic) 250 mg twice daily for one month. - Recommend Benefiber or Citrucel for fiber supplementation. - Provide samples of Ibogard. - Prescribe dicyclomine for cramping and urgency as needed. - Educate on dietary triggers and symptom management. - check labs for infection/inflammation, discussed colonoscopy, declines at this time, if labs are abnormal or she has anemia will consider getting to rule out IBD - consider xiphaxin trial - consider pelvic floor PT - follow up 2 months   Normocytic anemia 12/05/2023  HGB 11.1 MCV 88.9 Platelets 482 Recent Labs    12/05/23 1324  HGB 11.1*  - check CBC, iron, ferritin, B12 - consider endoscopic imaging based off labs, possible from recent Csection in Jan  Cdiff history -Start on florastor 250mg  1 cap BID for 1  month -Hand washing, wiping down surfaces, and avoiding contamination discussed with the patient.  -Avoid ABX in the future -Follow up if any worsening symptoms or no improving symptoms, ER precautions discussed   Patient Care Team: Patient, No Pcp Per as PCP - General (General Practice)  HISTORY OF PRESENT ILLNESS: 38 y.o. female with a past medical history listed below presents as a new patient for evaluation of loose stools and nausea.   Discussed the use of AI scribe software for clinical note transcription with the patient, who gave verbal consent to proceed.  History of Present Illness   Kathryn Moyer is a 38 year old female who presents with ongoing gastrointestinal issues following a C. diff infection after her third pregnancy.  She has experienced persistent loose stools since a C. diff infection diagnosed postpartum. The infection was confirmed via a stool test conducted by her OB GYN, and she was treated with a prescription that she cannot recall, which was costly but mostly covered by insurance. Since the infection, she continues to have loose stools, which occur sporadically and are associated with urgency. She has not experienced fecal incontinence but did have an episode of stool passage during sleep a few weeks ago, which she attributes to a recent illness. No significant weight loss, fevers, or chills, though she did experience chills when she was very sick.  Prior to the C. diff infection, she experienced gas and bloating but no significant abdominal pain or urgency. Her bowel movements varied from formed to loose stools, with no history of constipation, diarrhea, blood, or mucus in the stool. She denies any significant nausea, heartburn, or trouble swallowing. Her thyroid was noted to be slightly hyperactive postpartum but has  since normalized. No shortness of breath or chest pain.  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 She has experienced some discomfort in the right lower quadrant, which she associates with previous surgery and possible scar tissue.  No family history of colon cancer or autoimmune diseases. Her mother has similar gastrointestinal issues but no specific diagnosis.      She  reports that she has been smoking cigarettes. She has never used smokeless tobacco. She reports current alcohol use. She reports that she does not use drugs.  RELEVANT GI HISTORY, IMAGING AND LABS: Results   LABS GI pathogen panel: Negative for C. diff, positive for astrovirus (12/06/2023) Anemia evaluation: Anemia Thyroid function: Hyperthyroid postpartum, now euthyroid      CBC    Component Value Date/Time   WBC 5.8 12/05/2023 1324   RBC 3.87 12/05/2023 1324   HGB 11.1 (L) 12/05/2023 1324   HGB 11.9 06/30/2020 1142   HCT 34.4 (L) 12/05/2023 1324   HCT 36.2 06/30/2020 1142   PLT 482 (H) 12/05/2023 1324   PLT 424 06/30/2020 1142   MCV 88.9 12/05/2023 1324   MCV 96 06/30/2020 1142   MCH 28.7 12/05/2023 1324   MCHC 32.3 12/05/2023 1324   RDW 15.4 12/05/2023 1324   RDW 13.4 06/30/2020 1142   LYMPHSABS 1.4 08/27/2022 1407   LYMPHSABS 2.8 05/23/2018 1029   MONOABS 0.6 08/27/2022 1407   EOSABS 0.1 08/27/2022 1407   EOSABS 0.4 05/23/2018 1029   BASOSABS 0.1 08/27/2022 1407   BASOSABS 0.1 05/23/2018 1029   Recent Labs    12/05/23 1324  HGB 11.1*    CMP     Component Value Date/Time   NA 139 12/05/2023 1324   NA 142 06/30/2020 1142   K 3.6 12/05/2023 1324   CL 108 12/05/2023 1324   CO2 22 12/05/2023 1324   GLUCOSE 91 12/05/2023 1324   BUN 5 (L) 12/05/2023 1324   BUN 11 06/30/2020 1142   CREATININE 0.70 12/05/2023 1324   CALCIUM 8.4 (L) 12/05/2023 1324   PROT 6.2 (L) 12/05/2023 1324   PROT 6.8 06/30/2020 1142   ALBUMIN 3.3 (L) 12/05/2023 1324   ALBUMIN 4.4 06/30/2020 1142   AST 27 12/05/2023 1324   ALT 22 12/05/2023 1324   ALKPHOS 47 12/05/2023 1324   BILITOT 0.4 12/05/2023  1324   BILITOT 0.5 06/30/2020 1142   GFRNONAA >60 12/05/2023 1324   GFRAA 93 06/30/2020 1142      Latest Ref Rng & Units 12/05/2023    1:24 PM 08/27/2022    4:48 PM 06/30/2020   11:42 AM  Hepatic Function  Total Protein 6.5 - 8.1 g/dL 6.2  6.5  6.8   Albumin 3.5 - 5.0 g/dL 3.3  3.2  4.4   AST 15 - 41 U/L 27  21  13    ALT 0 - 44 U/L 22  20  14    Alk Phosphatase 38 - 126 U/L 47  73  52   Total Bilirubin 0.0 - 1.2 mg/dL 0.4  0.9  0.5       Current Medications:        Current Outpatient Medications (Other):    cholecalciferol (VITAMIN D3) 25 MCG (1000 UNIT) tablet, Take 1,000 Units by mouth daily.   dicyclomine (BENTYL) 20 MG tablet, Take 1 tablet (20 mg total) by mouth 3 (three) times daily as needed for spasms.   loperamide  (IMODIUM ) 2  MG capsule, Take 1 capsule (2 mg total) by mouth 4 (four) times daily as needed for diarrhea or loose stools.   Prenatal Vit-Fe Fumarate-FA (MULTIVITAMIN-PRENATAL) 27-0.8 MG TABS tablet, Take 1 tablet by mouth daily at 12 noon.  Medical History:  Past Medical History:  Diagnosis Date   Hx of varicella    Medical history non-contributory    Allergies:  Allergies  Allergen Reactions   Other Hives and Rash    *foods with high acidity*     Surgical History:  She  has a past surgical history that includes Anterior cruciate ligament repair; Cesarean section (N/A, 04/02/2014); Cesarean section (N/A, 09/07/2017); and Cesarean section (N/A, 08/08/2022). Family History:  Her family history includes Autism in her son; Cancer in her maternal grandmother; Diabetes in her maternal grandfather and paternal grandfather; Hearing loss in her paternal grandfather and paternal grandmother; Hypertension in her father and mother.  REVIEW OF SYSTEMS  : All other systems reviewed and negative except where noted in the History of Present Illness.  PHYSICAL EXAM: BP 122/64   Pulse 86   Ht 5\' 7"  (1.702 m)   Wt 238 lb (108 kg)   BMI 37.28 kg/m  Physical Exam    GENERAL APPEARANCE: Well nourished, in no apparent distress HEENT: No cervical lymphadenopathy, unremarkable thyroid, sclerae anicteric, conjunctiva pink RESPIRATORY: Respiratory effort normal, BS equal bilateral without rales, rhonchi, wheezing CARDIO: RRR with no MRGs, peripheral pulses intact ABDOMEN: Soft, non distended, active bowel sounds in all 4 quadrants, no tenderness to palpation, no rebound, no mass appreciated RECTAL: declines MUSCULOSKELETAL: Full ROM, normal gait, without edema SKIN: Dry, intact without rashes or lesions. No jaundice. NEURO: Alert, oriented, no focal deficits PSYCH: Cooperative, normal mood and affect.      Edmonia Gottron, PA-C 9:21 AM

## 2024-01-09 ENCOUNTER — Other Ambulatory Visit (INDEPENDENT_AMBULATORY_CARE_PROVIDER_SITE_OTHER)

## 2024-01-09 ENCOUNTER — Encounter: Payer: Self-pay | Admitting: Physician Assistant

## 2024-01-09 ENCOUNTER — Ambulatory Visit (INDEPENDENT_AMBULATORY_CARE_PROVIDER_SITE_OTHER): Admitting: Physician Assistant

## 2024-01-09 VITALS — BP 122/64 | HR 86 | Ht 67.0 in | Wt 238.0 lb

## 2024-01-09 DIAGNOSIS — Z8619 Personal history of other infectious and parasitic diseases: Secondary | ICD-10-CM

## 2024-01-09 DIAGNOSIS — R159 Full incontinence of feces: Secondary | ICD-10-CM

## 2024-01-09 DIAGNOSIS — D649 Anemia, unspecified: Secondary | ICD-10-CM

## 2024-01-09 DIAGNOSIS — A09 Infectious gastroenteritis and colitis, unspecified: Secondary | ICD-10-CM | POA: Diagnosis not present

## 2024-01-09 DIAGNOSIS — R195 Other fecal abnormalities: Secondary | ICD-10-CM

## 2024-01-09 LAB — CBC WITH DIFFERENTIAL/PLATELET
Basophils Absolute: 0.4 10*3/uL — ABNORMAL HIGH (ref 0.0–0.1)
Basophils Relative: 4.8 % — ABNORMAL HIGH (ref 0.0–3.0)
Eosinophils Absolute: 0.4 10*3/uL (ref 0.0–0.7)
Eosinophils Relative: 4.4 % (ref 0.0–5.0)
HCT: 37.2 % (ref 36.0–46.0)
Hemoglobin: 11.9 g/dL — ABNORMAL LOW (ref 12.0–15.0)
Lymphocytes Relative: 27.5 % (ref 12.0–46.0)
Lymphs Abs: 2.3 10*3/uL (ref 0.7–4.0)
MCHC: 32.1 g/dL (ref 30.0–36.0)
MCV: 87.7 fl (ref 78.0–100.0)
Monocytes Absolute: 0.6 10*3/uL (ref 0.1–1.0)
Monocytes Relative: 7.9 % (ref 3.0–12.0)
Neutro Abs: 4.5 10*3/uL (ref 1.4–7.7)
Neutrophils Relative %: 55.4 % (ref 43.0–77.0)
Platelets: 519 10*3/uL — ABNORMAL HIGH (ref 150.0–400.0)
RBC: 4.24 Mil/uL (ref 3.87–5.11)
RDW: 16.5 % — ABNORMAL HIGH (ref 11.5–15.5)
WBC: 8.2 10*3/uL (ref 4.0–10.5)

## 2024-01-09 LAB — COMPREHENSIVE METABOLIC PANEL WITH GFR
ALT: 14 U/L (ref 0–35)
AST: 13 U/L (ref 0–37)
Albumin: 4.4 g/dL (ref 3.5–5.2)
Alkaline Phosphatase: 52 U/L (ref 39–117)
BUN: 10 mg/dL (ref 6–23)
CO2: 25 meq/L (ref 19–32)
Calcium: 9.2 mg/dL (ref 8.4–10.5)
Chloride: 104 meq/L (ref 96–112)
Creatinine, Ser: 0.82 mg/dL (ref 0.40–1.20)
GFR: 89.83 mL/min (ref >60.00)
Glucose, Bld: 96 mg/dL (ref 70–99)
Potassium: 3.8 meq/L (ref 3.5–5.1)
Sodium: 137 meq/L (ref 135–145)
Total Bilirubin: 0.5 mg/dL (ref 0.2–1.2)
Total Protein: 7.4 g/dL (ref 6.0–8.3)

## 2024-01-09 LAB — IBC + FERRITIN
Ferritin: 5.6 ng/mL — ABNORMAL LOW (ref 10.0–291.0)
Iron: 56 ug/dL (ref 42–145)
Saturation Ratios: 11.2 % — ABNORMAL LOW (ref 20.0–50.0)
TIBC: 499.8 ug/dL — ABNORMAL HIGH (ref 250.0–450.0)
Transferrin: 357 mg/dL (ref 212.0–360.0)

## 2024-01-09 LAB — SEDIMENTATION RATE: Sed Rate: 15 mm/h (ref 0–20)

## 2024-01-09 LAB — VITAMIN B12: Vitamin B-12: 452 pg/mL (ref 211–911)

## 2024-01-09 LAB — C-REACTIVE PROTEIN: CRP: <1 mg/dL (ref 0.5–20.0)

## 2024-01-09 MED ORDER — DICYCLOMINE HCL 20 MG PO TABS: 20.0000 mg | ORAL_TABLET | Freq: Three times a day (TID) | ORAL | 0 refills | Status: AC | PRN

## 2024-01-09 NOTE — Patient Instructions (Addendum)
 Your provider has requested that you go to the basement level for lab work before leaving today. Press "B" on the elevator. The lab is located at the first door on the left as you exit the elevator.  We are ordering a "Diatherix" stool testing for you to take home and complete.  You have received a kit from our office today containing all necessary supplies to complete this test.  Please carefully read the stool collection instructions provided in the kit before opening the accompanying materials  OR an easier way is to please use toilet paper to wipe after your bowel movement and use the qtip/applicator provided to get a small volume of the stool from the toilet paper and place that in the tube.   Important to remember: -Place the label onto the "puritan opti-swab" TUBE. -This label should include your full name and date of birth.  - This label should have the DATE AND TIME stool was collects -After completing the test, you should secure the tube into the specimen biohazard bag.  -The laboratory request information sheet (including date and time of specimen collection) should be placed into the outside pocket of the specimen biohazard bag and returned to the [facility] lab with 2 days of collection.   If it is greater than two days from collection you will be asked to repeat the test.  If the label is missing from the tube with your name, date of birth, date and time of collection on it, you will have to repeat the test.  Any questions please message us  on my chart or call the office at [phone]   You may have POST INFECTIOUS IBS OR IRRITABLE BOWEL After an infection can spasm or be a little bit more sensitive. Try these things below:  Can do  low FODMAP- see below Try trial off milk/lactose products.  Add florastor 250 mg twice a daily for 1 month Add fiber like benefiber or citracel once a day Can do trial of IBGard for AB pain EVERY DAY- Take 1-2 capsules once a day for maintence or  twice a day during a flare Can take dicyclomine as needed.  if any worsening symptoms like blood in stool, weight loss, please call the office or go to the ER.    FODMAP stands for fermentable oligo-, di-, mono-saccharides and polyols (1). These are the scientific terms used to classify groups of carbs that are notorious for triggering digestive symptoms like bloating, gas and stomach pain.     Due to recent changes in healthcare laws, you may see the results of your imaging and laboratory studies on MyChart before your provider has had a chance to review them.  We understand that in some cases there may be results that are confusing or concerning to you. Not all laboratory results come back in the same time frame and the provider may be waiting for multiple results in order to interpret others.  Please give us  48 hours in order for your provider to thoroughly review all the results before contacting the office for clarification of your results.    I appreciate the  opportunity to care for you  Thank You   [facility]

## 2024-01-09 NOTE — Progress Notes (Signed)
 I agree with the assessment and plan as outlined by Ms. Steffanie Dunn.

## 2024-01-10 ENCOUNTER — Ambulatory Visit: Payer: Self-pay | Admitting: Physician Assistant

## 2024-01-10 LAB — TISSUE TRANSGLUTAMINASE, IGA: (tTG) Ab, IgA: <1 U/mL

## 2024-01-10 LAB — IGA: Immunoglobulin A: 156 mg/dL (ref 47–310)

## 2024-01-21 ENCOUNTER — Telehealth: Payer: Self-pay | Admitting: Physician Assistant

## 2024-01-21 NOTE — Telephone Encounter (Signed)
 Diatherix C. difficile negative

## 2024-07-22 ENCOUNTER — Ambulatory Visit
Admission: EM | Admit: 2024-07-22 | Discharge: 2024-07-22 | Disposition: A | Discharge status: Home / Self Care | Attending: Family Medicine | Admitting: Family Medicine

## 2024-07-22 DIAGNOSIS — J329 Chronic sinusitis, unspecified: Secondary | ICD-10-CM

## 2024-07-22 DIAGNOSIS — F17200 Nicotine dependence, unspecified, uncomplicated: Secondary | ICD-10-CM

## 2024-07-22 DIAGNOSIS — J40 Bronchitis, not specified as acute or chronic: Secondary | ICD-10-CM

## 2024-07-22 MED ORDER — CETIRIZINE HCL 10 MG PO TABS: 10.0000 mg | ORAL_TABLET | Freq: Every day | ORAL | 0 refills | Status: AC

## 2024-07-22 MED ORDER — PROMETHAZINE-DM 6.25-15 MG/5ML PO SYRP: 5.0000 mL | ORAL_SOLUTION | Freq: Three times a day (TID) | ORAL | 0 refills | Status: AC | PRN

## 2024-07-22 MED ORDER — AMOXICILLIN-POT CLAVULANATE 875-125 MG PO TABS: 1.0000 | ORAL_TABLET | Freq: Two times a day (BID) | ORAL | 0 refills | Status: AC

## 2024-07-22 MED ORDER — PREDNISONE 20 MG PO TABS: ORAL_TABLET | ORAL | 0 refills | Status: AC

## 2024-07-22 NOTE — Discharge Instructions (Signed)
 Start amoxicillin -clavulanate and prednisone  for your sinobronchitis infection. Use Zyrtec  and cough syrup as needed.

## 2024-07-22 NOTE — ED Provider Notes (Signed)
 [facility]  Note:  This document was prepared using Conservation officer, historic buildings and may include unintentional dictation errors.  MRN: [id number] DOB: [birth year]  Subjective:   Kathryn Moyer is a 38 y.o. female presenting for 2-3 week history of sinus congestion, sinus drainage, productive cough, chest congestion, chest discomfort. Patient is a smoker, smokes ~3 cigarettes daily. No asthma.   Current Outpatient Medications  Medication Instructions   cholecalciferol (VITAMIN D3) 1,000 Units, Daily   dicyclomine  (BENTYL ) 20 mg, Oral, 3 times daily PRN   loperamide  (IMODIUM ) 2 mg, Oral, 4 times daily PRN   Prenatal Vit-Fe Fumarate-FA (MULTIVITAMIN-PRENATAL) 27-0.8 MG TABS tablet 1 tablet, Daily    Allergies[1]  Past Medical History:  Diagnosis Date   Hx of varicella    Medical history non-contributory      Past Surgical History:  Procedure Laterality Date   ANTERIOR CRUCIATE LIGAMENT REPAIR     R klnee   CESAREAN SECTION N/A 04/02/2014   Procedure: CESAREAN SECTION;  Surgeon: Nathanel LELON Bunker, MD;  Location: [facility];  Service: Obstetrics;  Laterality: N/A;   CESAREAN SECTION N/A 09/07/2017   Procedure: REPEAT CESAREAN SECTION;  Surgeon: Bunker Nathanel, MD;  Location: [facility]IRTHING SUIT[facility];  Service: Obstetrics;  Laterality: N/A;  Tracey RNFA   CESAREAN SECTION N/A 08/08/2022   Procedure: CESAREAN SECTION;  Surgeon: Bunker Nathanel, MD;  Location: [facility] LD ORS;  Service: Obstetrics;  Laterality: N/A;    Family History  Problem Relation Age of Onset   Hearing loss Paternal Grandmother        deaf   Hearing loss Paternal Grandfather        deaf   Diabetes Paternal Grandfather    Hypertension Mother    Hypertension Father    Cancer Maternal Grandmother        lung   Diabetes Maternal Grandfather    Autism Son     Social History   Occupational History   Occupation: production designer, theatre/television/film  Tobacco Use   Smoking status: Every Day    Current packs/day: 0.00     Types: Cigarettes    Last attempt to quit: 08/31/2009    Years since quitting: 14.9   Smokeless tobacco: Never  Vaping Use   Vaping status: Never Used  Substance and Sexual Activity   Alcohol use: Yes    Comment: occ   Drug use: No   Sexual activity: Yes    Birth control/protection: None     ROS   Objective:   Vitals: BP 120/80 (BP Location: Right Arm)   Pulse 94   Temp 99.4 F (37.4 C) (Oral)   Resp 20   LMP 06/26/2024   SpO2 97%   Physical Exam Constitutional:      General: She is not in acute distress.    Appearance: Normal appearance. She is well-developed and normal weight. She is not ill-appearing, toxic-appearing or diaphoretic.  HENT:     Head: Normocephalic and atraumatic.     Right Ear: Tympanic membrane, ear canal and external ear normal. No drainage or tenderness. No middle ear effusion. There is no impacted cerumen. Tympanic membrane is not erythematous or bulging.     Left Ear: Tympanic membrane, ear canal and external ear normal. No drainage or tenderness.  No middle ear effusion. There is no impacted cerumen. Tympanic membrane is not erythematous or bulging.     Nose: Congestion present. No rhinorrhea.     Mouth/Throat:     Mouth: Mucous membranes are moist.  No oral lesions.     Pharynx: Posterior oropharyngeal erythema (with associated postnasal drainage overlying pharynx) present. No pharyngeal swelling, oropharyngeal exudate or uvula swelling.     Tonsils: No tonsillar exudate or tonsillar abscesses. 0 on the right. 0 on the left.  Eyes:     General: No scleral icterus.       Right eye: No discharge.        Left eye: No discharge.     Extraocular Movements: Extraocular movements intact.     Right eye: Normal extraocular motion.     Left eye: Normal extraocular motion.     Conjunctiva/sclera: Conjunctivae normal.  Cardiovascular:     Rate and Rhythm: Normal rate and regular rhythm.     Heart sounds: Normal heart sounds. No murmur heard.    No  friction rub. No gallop.  Pulmonary:     Effort: Pulmonary effort is normal. No respiratory distress.     Breath sounds: No stridor. Rhonchi (trace over mid lung fields bilaterally) present. No wheezing or rales.  Chest:     Chest wall: No tenderness.  Musculoskeletal:     Cervical back: Normal range of motion and neck supple.  Lymphadenopathy:     Cervical: No cervical adenopathy.  Skin:    General: Skin is warm and dry.  Neurological:     General: No focal deficit present.     Mental Status: She is alert and oriented to person, place, and time.  Psychiatric:        Mood and Affect: Mood normal.        Behavior: Behavior normal.    Assessment and Plan :   PDMP not reviewed this encounter.  1. Sinobronchitis   2. Smoker      Start Augmentin  and prednisone  for management of sinobronchitis likely worsened by her smoking.  Will defer imaging for now.  Supportive care otherwise.  Counseled patient on potential for adverse effects with medications prescribed/recommended today, ER and return-to-clinic precautions discussed, patient verbalized understanding.     [1]  Allergies Allergen Reactions   Other Hives and Rash    *foods with high acidityDEWAINE Christopher [place], [place] 07/22/24 1613

## 2024-07-22 NOTE — ED Triage Notes (Signed)
 Pt c/o cough, head/chest congestion since the end of November-NAD-steady gait

## 2024-07-25 ENCOUNTER — Ambulatory Visit: Admitting: Internal Medicine

## 2024-08-13 ENCOUNTER — Ambulatory Visit (INDEPENDENT_AMBULATORY_CARE_PROVIDER_SITE_OTHER): Admitting: Internal Medicine

## 2024-08-13 ENCOUNTER — Encounter: Payer: Self-pay | Admitting: Internal Medicine

## 2024-08-13 VITALS — BP 114/80 | HR 93 | Temp 97.9°F | Resp 16 | Ht 67.0 in | Wt 242.9 lb

## 2024-08-13 DIAGNOSIS — L508 Other urticaria: Secondary | ICD-10-CM

## 2024-08-13 DIAGNOSIS — K9049 Malabsorption due to intolerance, not elsewhere classified: Secondary | ICD-10-CM | POA: Diagnosis not present

## 2024-08-13 DIAGNOSIS — J3089 Other allergic rhinitis: Secondary | ICD-10-CM

## 2024-08-13 MED ORDER — FLUTICASONE PROPIONATE 50 MCG/ACT NA SUSP: 2.0000 | Freq: Every day | NASAL | 5 refills | Status: AC

## 2024-08-13 MED ORDER — FAMOTIDINE 20 MG PO TABS: 20.0000 mg | ORAL_TABLET | Freq: Two times a day (BID) | ORAL | 3 refills | Status: AC

## 2024-08-13 MED ORDER — CETIRIZINE HCL 10 MG PO TABS: 10.0000 mg | ORAL_TABLET | Freq: Every day | ORAL | 5 refills | Status: AC

## 2024-08-13 NOTE — Patient Instructions (Addendum)
 Other Allergic Rhinitis: - Use nasal saline rinses before nose sprays such as with Neilmed Sinus Rinse.  Use distilled water.   - Use Flonase  2 sprays each nostril daily. Aim upward and outward. - Use Zyrtec  10 mg daily.   Urticaria (Hives): - At this time etiology of hives and swelling is unknown. Hives can be caused by a variety of different triggers including illness/infection, pressure, vibrations, extremes of temperature to name a few however majority of the time there is no identifiable trigger.  -Continue Zyrtec  10mg  daily.   -If no improvement in 2-3 days, increase to Zyrtec  10mg  twice daily.   -If no improvement in 2-3 days, add Pepcid  20mg  twice daily and continue Zyrtec  10mg  twice daily.  Food Intolerance - Shellfish with stomach pain.  Recommend avoidance

## 2024-08-13 NOTE — Progress Notes (Signed)
 "  NEW PATIENT  Date of Service/Encounter:  08/13/2024  Consult requested by: Patient, No Pcp Per   Subjective:   Kathryn Moyer (DOB: 1986/03/06) is a 39 y.o. female who presents to the clinic on 08/13/2024 with a chief complaint of Urticaria (Pt is present for sudden hives, onset about 2-23months ago, currently taking claritin  daily to help manage breakouts. pcp did some food labs, shrimp was positive. Pt also report that ibuprofen  causes swelling  as well.) .    History obtained from: chart review and patient.   Rhinitis:  Started many years ago.  Symptoms include: nasal congestion, rhinorrhea, and post nasal drainage  Occurs year-round Potential triggers: not sure  Treatments tried:  Zyrtec  PRN  Previous allergy testing: no History of sinus surgery: no Nonallergic triggers: none   Hives: No history of hives in the past. Started 2-3 months ago.  Rashes/hives are itchy, raised.  They can occur all over. Also notes lip swelling and facial swelling, usually just one side though. This happened when she took the ibuprofen ; but now uses tylenol -ibuprofen  and is doing fine.   She thought initially it was related to wearing watches but now its happening all over the body.  Takes Zyrtec  or Claritin  1 tablet daily but when she stops it, the hives flare up.  Last flare up was about a month ago when she stopped the anti histamines.   No scarring/no pain. Food panel showed reactivity to shrimp and sesame seeds.    Food Reactions: Notes having stomach pain with shellfish, no hives/swelling/respiratory symptoms.       Reviewed:  07/22/2024: seen in UC for sinus congestion/drainage, cough, current smoker, no asthma. Rhonchi on exam. Started on Augmentin  + Prednisone  for sinobronchitis.   06/03/2024: seen at [facility] for hives all over, Claritin  helping.  Refer to allergy.  Given medrol dosepak.   01/09/2024: seen by GI for loose stools, hx of c diff. 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   05/2024: sIgE positive to shrimp and sesame.  Rest of the commonly allergenic foods were negative.  Past Medical History: Past Medical History:  Diagnosis Date   Hx of varicella    Medical history non-contributory    Past Surgical History: Past Surgical History:  Procedure Laterality Date   ANTERIOR CRUCIATE LIGAMENT REPAIR     R klnee   CESAREAN SECTION N/A 04/02/2014   Procedure: CESAREAN SECTION;  Surgeon: Nathanel LELON Bunker, MD;  Location: [facility];  Service: Obstetrics;  Laterality: N/A;   CESAREAN SECTION N/A 09/07/2017   Procedure: REPEAT CESAREAN SECTION;  Surgeon: Bunker Nathanel, MD;  Location: [facility] BIRTHING SUIT[facility];  Service: Obstetrics;  Laterality: N/A;  Tracey RNFA   CESAREAN SECTION N/A 08/08/2022   Procedure: CESAREAN SECTION;  Surgeon: Bunker Nathanel, MD;  Location: [facility];  Service: Obstetrics;  Laterality: N/A;    Family History: Family History  Problem Relation Age of Onset   Hypertension Mother    Hypertension Father    Cancer Maternal Grandmother        lung   Diabetes Maternal Grandfather    Hearing loss Paternal Grandmother        deaf   Hearing loss Paternal Grandfather        deaf   Diabetes Paternal Grandfather    Autism Son    Allergic rhinitis Neg Hx    Angioedema Neg Hx    Asthma Neg Hx    Atopy Neg Hx    Eczema Neg  Hx    Immunodeficiency Neg Hx    Urticaria Neg Hx     Social History:  Flooring in bedroom: carpet Pets: dog Tobacco use/exposure: none Job: international aid/development worker at [employer]   Medication List:  Allergies as of 08/13/2024       Reactions   Ibuprofen  Swelling   Shrimp (diagnostic) Hives   Other Hives, Rash   *foods with high acidity*        Medication List        Accurate as of August 13, 2024  9:24 AM. If you have any questions, ask your nurse or doctor.          amoxicillin -clavulanate 875-125 MG tablet Commonly known as: AUGMENTIN  Take 1 tablet by mouth 2 (two) times daily.    cetirizine  10 MG tablet Commonly known as: ZyrTEC  Allergy Take 1 tablet (10 mg total) by mouth daily.   cholecalciferol 25 MCG (1000 UNIT) tablet Commonly known as: VITAMIN D3 Take 1,000 Units by mouth daily.   dicyclomine  20 MG tablet Commonly known as: BENTYL  Take 1 tablet (20 mg total) by mouth 3 (three) times daily as needed for spasms.   loperamide  2 MG capsule Commonly known as: IMODIUM  Take 1 capsule (2 mg total) by mouth 4 (four) times daily as needed for diarrhea or loose stools.   multivitamin-prenatal 27-0.8 MG Tabs tablet Take 1 tablet by mouth daily at 12 noon.   predniSONE  20 MG tablet Commonly known as: DELTASONE  Take 2 tablets daily with breakfast.   promethazine -dextromethorphan 6.25-15 MG/5ML syrup Commonly known as: PROMETHAZINE -DM Take 5 mLs by mouth 3 (three) times daily as needed for cough.         REVIEW OF SYSTEMS: Pertinent positives and negatives discussed in HPI.   Objective:   Physical Exam: BP 114/80   Pulse 93   Temp 97.9 F (36.6 C) (Temporal)   Resp 16   Ht 5' 7 (1.702 m)   Wt 242 lb 14.4 oz (110.2 kg)   LMP 06/26/2024   SpO2 97%   BMI 38.04 kg/m  Body mass index is 38.04 kg/m. GEN: alert, well developed HEENT: clear conjunctiva, nose with + mild inferior turbinate hypertrophy, boggy nasal mucosa, + clear rhinorrhea, + cobblestoning HEART: regular rate and rhythm, no murmur LUNGS: clear to auscultation bilaterally, no coughing, unlabored respiration ABDOMEN: soft, non distended  SKIN: no rashes or lesions  Assessment:   1. Chronic urticaria   2. Other allergic rhinitis   3. Food intolerance     Plan/Recommendations:  Other Allergic Rhinitis: - Due to turbinate hypertrophy, recurrent hives and unresponsive to over the counter meds, will perform testing to identify aeroallergen triggers.  Unable to hold anti histamines due to hives flaring up so will do blood testing.   - Use nasal saline rinses before nose sprays  such as with Neilmed Sinus Rinse.  Use distilled water.   - Use Flonase  2 sprays each nostril daily. Aim upward and outward. - Use Zyrtec  10 mg daily.   Urticaria (Hives): - At this time etiology of hives and swelling is unknown. Hives can be caused by a variety of different triggers including illness/infection, pressure, vibrations, extremes of temperature to name a few however majority of the time there is no identifiable trigger.  -Continue Zyrtec  10mg  daily.   -If no improvement in 2-3 days, increase to Zyrtec  10mg  twice daily.   -If no improvement in 2-3 days, add Pepcid  20mg  twice daily and continue Zyrtec  10mg  twice daily.  Food  Intolerance - Shellfish with stomach pain.  Recommend avoidance.    Return in about 6 weeks (around 09/24/2024).  Arleta Blanch, MD [facility]        "

## 2024-08-16 LAB — ALLERGEN PROFILE WITH TOTAL IGE, RESPIRATORY-AREA 2
Alternaria Alternata IgE: 3.15 kU/L — AB
Aspergillus Fumigatus IgE: 0.69 kU/L — AB
Bermuda Grass IgE: 0.18 kU/L — AB
Cat Dander IgE: <0.1 kU/L
Cedar, Mountain IgE: 0.49 kU/L — AB
Cladosporium Herbarum IgE: 0.13 kU/L — AB
Cockroach, German IgE: 4.45 kU/L — AB
Common Silver Birch IgE: 0.46 kU/L — AB
Cottonwood IgE: 0.27 kU/L — AB
D Farinae IgE: 7.63 kU/L — AB
D Pteronyssinus IgE: 9.89 kU/L — AB
Dog Dander IgE: 0.36 kU/L — AB
Elm, American IgE: 0.25 kU/L — AB
IgE (Immunoglobulin E), Serum: 803 [IU]/mL — ABNORMAL HIGH (ref 6–495)
Johnson Grass IgE: 0.14 kU/L — AB
Maple/Box Elder IgE: 0.21 kU/L — AB
Mouse Urine IgE: <0.1 kU/L
Oak, White IgE: 0.23 kU/L — AB
Pecan, Hickory IgE: 3.19 kU/L — AB
Penicillium Chrysogen IgE: <0.1 kU/L
Pigweed, Rough IgE: 0.35 kU/L — AB
Ragweed, Short IgE: 1.11 kU/L — AB
Sheep Sorrel IgE Qn: 0.13 kU/L — AB
Timothy Grass IgE: 3.34 kU/L — AB
White Mulberry IgE: <0.1 kU/L

## 2024-08-16 LAB — CHRONIC URTICARIA PD-BAT: Pooled Donor- BAT CU: 5.4 % (ref 0.00–10.60)

## 2024-08-18 ENCOUNTER — Ambulatory Visit: Payer: Self-pay | Admitting: Internal Medicine

## 2024-09-24 ENCOUNTER — Ambulatory Visit: Admitting: Allergy
